# Patient Record
Sex: Female | Born: 1970 | Race: White | Hispanic: No | Marital: Married | Smoking: Never smoker
Health system: Southern US, Community
[De-identification: ages and names within clinical notes are randomized; demographics above are authoritative.]

## PROBLEM LIST (undated history)

## (undated) ENCOUNTER — Emergency Department (HOSPITAL_COMMUNITY): Admission: EM | Payer: Self-pay | Source: Home / Self Care

## (undated) DIAGNOSIS — E079 Disorder of thyroid, unspecified: Secondary | ICD-10-CM

## (undated) DIAGNOSIS — I10 Essential (primary) hypertension: Secondary | ICD-10-CM

## (undated) DIAGNOSIS — F419 Anxiety disorder, unspecified: Secondary | ICD-10-CM

## (undated) DIAGNOSIS — F32A Depression, unspecified: Secondary | ICD-10-CM

## (undated) DIAGNOSIS — F329 Major depressive disorder, single episode, unspecified: Secondary | ICD-10-CM

## (undated) DIAGNOSIS — K589 Irritable bowel syndrome without diarrhea: Secondary | ICD-10-CM

## (undated) DIAGNOSIS — G43109 Migraine with aura, not intractable, without status migrainosus: Secondary | ICD-10-CM

## (undated) DIAGNOSIS — K219 Gastro-esophageal reflux disease without esophagitis: Secondary | ICD-10-CM

## (undated) HISTORY — DX: Migraine with aura, not intractable, without status migrainosus: G43.109

## (undated) HISTORY — DX: Depression, unspecified: F32.A

## (undated) HISTORY — PX: NASAL SEPTUM SURGERY: SHX37

## (undated) HISTORY — DX: Gastro-esophageal reflux disease without esophagitis: K21.9

## (undated) HISTORY — DX: Disorder of thyroid, unspecified: E07.9

## (undated) HISTORY — DX: Major depressive disorder, single episode, unspecified: F32.9

## (undated) HISTORY — DX: Irritable bowel syndrome without diarrhea: K58.9

## (undated) HISTORY — PX: TOTAL HIP ARTHROPLASTY: SHX124

## (undated) HISTORY — DX: Anxiety disorder, unspecified: F41.9

## (undated) HISTORY — DX: Essential (primary) hypertension: I10

---

## 2004-09-05 ENCOUNTER — Ambulatory Visit: Payer: Self-pay | Admitting: Cardiology

## 2011-09-18 ENCOUNTER — Other Ambulatory Visit: Payer: Self-pay | Admitting: Obstetrics & Gynecology

## 2011-09-18 DIAGNOSIS — N6320 Unspecified lump in the left breast, unspecified quadrant: Secondary | ICD-10-CM

## 2011-10-01 ENCOUNTER — Ambulatory Visit
Admission: RE | Admit: 2011-10-01 | Discharge: 2011-10-01 | Disposition: A | Discharge status: Home / Self Care | Payer: Managed Care, Other (non HMO) | Source: Ambulatory Visit | Attending: Obstetrics & Gynecology | Admitting: Obstetrics & Gynecology

## 2011-10-01 DIAGNOSIS — N6320 Unspecified lump in the left breast, unspecified quadrant: Secondary | ICD-10-CM

## 2011-10-01 LAB — HM MAMMOGRAPHY: HM Mammogram: NORMAL

## 2013-05-31 ENCOUNTER — Ambulatory Visit (INDEPENDENT_AMBULATORY_CARE_PROVIDER_SITE_OTHER): Payer: Managed Care, Other (non HMO) | Admitting: Internal Medicine

## 2013-05-31 ENCOUNTER — Ambulatory Visit (INDEPENDENT_AMBULATORY_CARE_PROVIDER_SITE_OTHER)
Admission: RE | Admit: 2013-05-31 | Discharge: 2013-05-31 | Disposition: A | Discharge status: Home / Self Care | Payer: Managed Care, Other (non HMO) | Source: Ambulatory Visit | Attending: Internal Medicine | Admitting: Internal Medicine

## 2013-05-31 ENCOUNTER — Encounter: Payer: Self-pay | Admitting: Internal Medicine

## 2013-05-31 ENCOUNTER — Other Ambulatory Visit (INDEPENDENT_AMBULATORY_CARE_PROVIDER_SITE_OTHER): Payer: Managed Care, Other (non HMO)

## 2013-05-31 ENCOUNTER — Institutional Professional Consult (permissible substitution): Payer: Self-pay | Admitting: Critical Care Medicine

## 2013-05-31 VITALS — BP 90/60 | HR 80 | Temp 97.8°F | Ht 62.5 in | Wt 111.8 lb

## 2013-05-31 VITALS — BP 112/70 | HR 92 | Temp 97.8°F | Resp 16 | Ht 63.0 in | Wt 112.0 lb

## 2013-05-31 DIAGNOSIS — R059 Cough, unspecified: Secondary | ICD-10-CM

## 2013-05-31 DIAGNOSIS — R0600 Dyspnea, unspecified: Secondary | ICD-10-CM

## 2013-05-31 DIAGNOSIS — E039 Hypothyroidism, unspecified: Secondary | ICD-10-CM

## 2013-05-31 DIAGNOSIS — R0989 Other specified symptoms and signs involving the circulatory and respiratory systems: Secondary | ICD-10-CM

## 2013-05-31 DIAGNOSIS — R05 Cough: Secondary | ICD-10-CM

## 2013-05-31 DIAGNOSIS — J45901 Unspecified asthma with (acute) exacerbation: Secondary | ICD-10-CM | POA: Insufficient documentation

## 2013-05-31 DIAGNOSIS — R061 Stridor: Secondary | ICD-10-CM

## 2013-05-31 LAB — LIPID PANEL
Cholesterol: 232 mg/dL — ABNORMAL HIGH (ref 0–200)
HDL: 89.1 mg/dL (ref >39.00)
VLDL: 15.4 mg/dL (ref 0.0–40.0)

## 2013-05-31 LAB — COMPREHENSIVE METABOLIC PANEL
BUN: 9 mg/dL (ref 6–23)
CO2: 28 mEq/L (ref 19–32)
Calcium: 9.4 mg/dL (ref 8.4–10.5)
Chloride: 104 mEq/L (ref 96–112)
Creatinine, Ser: 0.9 mg/dL (ref 0.4–1.2)
GFR: 70.26 mL/min (ref >60.00)
Glucose, Bld: 68 mg/dL — ABNORMAL LOW (ref 70–99)

## 2013-05-31 LAB — CBC WITH DIFFERENTIAL/PLATELET
Basophils Absolute: 0 10*3/uL (ref 0.0–0.1)
Basophils Relative: 0.4 % (ref 0.0–3.0)
Hemoglobin: 14.8 g/dL (ref 12.0–15.0)
Lymphocytes Relative: 20.8 % (ref 12.0–46.0)
Monocytes Relative: 8.8 % (ref 3.0–12.0)
Neutro Abs: 4.4 10*3/uL (ref 1.4–7.7)
RBC: 4.66 Mil/uL (ref 3.87–5.11)
RDW: 12.8 % (ref 11.5–14.6)
WBC: 6.5 10*3/uL (ref 4.5–10.5)

## 2013-05-31 LAB — LDL CHOLESTEROL, DIRECT: Direct LDL: 123.8 mg/dL

## 2013-05-31 MED ORDER — PANTOPRAZOLE SODIUM 40 MG PO TBEC: 40.0000 mg | DELAYED_RELEASE_TABLET | Freq: Every day | ORAL | Status: DC

## 2013-05-31 MED ORDER — FAMOTIDINE 20 MG PO TABS: ORAL_TABLET | ORAL | Status: DC

## 2013-05-31 MED ORDER — TRAMADOL HCL 50 MG PO TABS: ORAL_TABLET | ORAL | Status: DC

## 2013-05-31 MED ORDER — LEVOTHYROXINE SODIUM 25 MCG PO CAPS: 1.0000 | ORAL_CAPSULE | Freq: Every day | ORAL | Status: DC

## 2013-05-31 MED ORDER — PREDNISONE (PAK) 10 MG PO TABS: ORAL_TABLET | ORAL | Status: DC

## 2013-05-31 NOTE — Patient Instructions (Addendum)

## 2013-05-31 NOTE — Assessment & Plan Note (Addendum)
She has already tried steroids and inhalers with no improvement, I am concerned that this is something more complicated than asthma so she will see pulm this afternoon For now, I will check her CBC and ESR to see if there is concern for allergic disease, vasculitis, etc

## 2013-05-31 NOTE — Progress Notes (Signed)
  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  Has ent doctor but has not seen him. Has hypothyroidism but no obvious goiter by hx.  Has been intubated several times for surgery but not recently.  No obvious daytime variabilty or assoc   chest tightness, subjective wheeze overt sinus or hb symptoms. No unusual exp hx or h/o childhood pna/ asthma or knowledge of premature birth.    Also denies any obvious fluctuation of symptoms with weather or environmental changes or other aggravating or alleviating factors except as outlined above   Review of Systems  Constitutional: Negative for fever, chills and unexpected weight change.  HENT: Negative for ear pain, nosebleeds, congestion, sore throat, rhinorrhea, sneezing, trouble swallowing, dental problem, voice change, postnasal drip and sinus pressure.   Eyes: Negative for visual disturbance.  Respiratory: Positive for cough and shortness of breath. Negative for choking.   Cardiovascular: Negative for chest pain and leg swelling.  Gastrointestinal: Negative for vomiting, abdominal pain and diarrhea.  Genitourinary: Negative for difficulty urinating.  Musculoskeletal: Negative for arthralgias.  Skin: Negative for rash.  Neurological: Negative for tremors, syncope and headaches.  Hematological: Does not bruise/bleed easily.       Objective:   Physical Exam   Anxious amb wf good phonation but classic pseudowheeze  Wt Readings from Last 3 Encounters:  05/31/13 111 lb 12.8 oz (50.712 kg)  05/31/13 112 lb (50.803 kg)     HEENT: nl  dentition, turbinates, and orophanx. Nl external ear canals without cough reflex   NECK :  without JVD/Nodes/TM/ nl carotid upstrokes bilaterally   LUNGS: no acc muscle use, clear to A and P bilaterally without cough on insp or exp maneuvers but classic insp "pseudowheeze"    CV:  RRR  no s3 or murmur or increase in P2, no edema   ABD:  soft and nontender with nl excursion in the supine position. No bruits or organomegaly, bowel sounds nl  MS:  warm without deformities, calf tenderness, cyanosis or clubbing  SKIN: warm and dry without lesions    NEURO:  alert, approp, no deficits   cxr 05/31/12 Nl trachea/ negative chest findings     Assessment & Plan:

## 2013-05-31 NOTE — Assessment & Plan Note (Signed)
Her Chest xray is normal but it sounds like she has upper airway obstruction so I have set her up to see Dr. Sherene Sires this afternoon for further evaluation

## 2013-05-31 NOTE — Assessment & Plan Note (Signed)
She will start low dose tirosint Will recheck her labs today

## 2013-05-31 NOTE — Patient Instructions (Addendum)
The key to effective treatment for your cough is eliminating the non-stop cycle of cough you're stuck in long enough to let your airway heal completely and then see if there is anything still making you cough once you stop the cough suppression, but this should take no more than 5 days to figure out  First take delsym two tsp every 12 hours and supplement if needed with  tramadol 50 mg up to 2 every 4 hours to suppress the urge to cough at all or even clear your throat. Swallowing water or using ice chips/non mint and menthol containing candies (such as lifesavers or sugarless jolly ranchers) are also effective.  You should rest your voice and avoid activities that you know make you cough.  Once you have eliminated the cough for 3 straight days try reducing the tramadol first,  then the delsym as tolerated.    Pantoprazole (protonix) 40 mg   Take 30-60 min before first meal of the day and Pepcid 20 mg one bedtime until return to office - this is the best way to tell whether stomach acid is contributing to your problem.    GERD (REFLUX)  is an extremely common cause of respiratory symptoms, many times with no significant heartburn at all.    It can be treated with medication, but also with lifestyle changes including avoidance of late meals, excessive alcohol, smoking cessation, and avoid fatty foods, chocolate, peppermint, colas, red wine, and acidic juices such as orange juice.  NO MINT OR MENTHOL PRODUCTS SO NO COUGH DROPS  USE SUGARLESS CANDY INSTEAD (jolley ranchers or Stover's)  NO OIL BASED VITAMINS - use powdered substitutes.    Prednisone 10 mg take  4 each am x 2 days,   2 each am x 2 days,  1 each am x 2 days and stop    Next step is to see Dr Verdie Drown for a quick look at your upper airway - give him a call and see if you get an appt for "stridor" within a week and if your can't call me for ENT referral in [facility]  Go to ER in meantime if condition worsens on above plan.

## 2013-05-31 NOTE — Progress Notes (Signed)
  Subjective:    Patient ID: Katrina Ross, female    DOB: Apr 21, 1971, 42 y.o.   MRN: [id number]  Cough This is a recurrent problem. The current episode started more than 1 month ago. The problem has been gradually worsening. The problem occurs every few hours. The cough is non-productive. Associated symptoms include shortness of breath and wheezing. Pertinent negatives include no chest pain, chills, ear congestion, ear pain, fever, headaches, heartburn, hemoptysis, myalgias, nasal congestion, postnasal drip, rash, rhinorrhea, sore throat, sweats or weight loss. She has tried oral steroids, a beta-agonist inhaler and steroid inhaler (she was seen one month ago at an [facility] in [place] and was given a steroid shot and inhalers and zpak that did not help at all) for the symptoms. The treatment provided no relief.      Review of Systems  Constitutional: Positive for fatigue. Negative for fever, chills, weight loss, diaphoresis, activity change, appetite change and unexpected weight change.  HENT: Negative for ear pain, sore throat, rhinorrhea and postnasal drip.   Eyes: Negative.   Respiratory: Positive for cough, shortness of breath, wheezing and stridor. Negative for apnea, hemoptysis, choking and chest tightness.   Cardiovascular: Negative.  Negative for chest pain, palpitations and leg swelling.  Gastrointestinal: Positive for constipation. Negative for heartburn, nausea, vomiting, abdominal pain, diarrhea, abdominal distention, anal bleeding and rectal pain.  Endocrine: Negative.   Genitourinary: Negative.   Musculoskeletal: Negative.  Negative for myalgias.  Skin: Negative.  Negative for rash.  Allergic/Immunologic: Negative.   Neurological: Negative.  Negative for headaches.  Hematological: Negative.   Psychiatric/Behavioral: Negative.        Objective:   Physical Exam  Vitals reviewed. Constitutional: She is oriented to person, place, and time. She appears well-developed and  well-nourished. No distress.  HENT:  Head: Normocephalic and atraumatic.  Mouth/Throat: Oropharynx is clear and moist. No oropharyngeal exudate.  Eyes: Conjunctivae are normal. Right eye exhibits no discharge. Left eye exhibits no discharge. No scleral icterus.  Neck: Normal range of motion. Neck supple. No JVD present. No tracheal deviation present. No thyromegaly present.  Cardiovascular: Normal rate, regular rhythm, normal heart sounds and intact distal pulses.  Exam reveals no gallop and no friction rub.   No murmur heard. Pulmonary/Chest: Accessory muscle usage and stridor (she has mild exp stridor and audible wheezing) present. Not tachypneic. She is in respiratory distress. She has no decreased breath sounds. She has wheezes in the right middle field, the right lower field, the left middle field and the left lower field. She has rhonchi in the right upper field and the left upper field. She has no rales. She exhibits no tenderness.  Abdominal: Soft. Bowel sounds are normal. She exhibits no distension and no mass. There is no tenderness. There is no rebound and no guarding.  Musculoskeletal: Normal range of motion. She exhibits no edema and no tenderness.  Lymphadenopathy:    She has no cervical adenopathy.  Neurological: She is oriented to person, place, and time.  Skin: Skin is warm and dry. No rash noted. She is not diaphoretic. No erythema. No pallor.  Psychiatric: She has a normal mood and affect. Her behavior is normal. Judgment and thought content normal.     Lab Results  Component Value Date   TSH 11.61 05/01/2013       Assessment & Plan:

## 2013-05-31 NOTE — Progress Notes (Deleted)
  Subjective:    Patient ID: Katrina Ross, female    DOB: Nov 20, 1970, 42 y.o.   MRN: [id number]  HPI    Review of Systems     Objective:   Physical Exam  Pulmonary/Chest: No accessory muscle usage. Not tachypneic. She is in respiratory distress. She has no decreased breath sounds. She has wheezes in the right lower field and the left lower field. She has rhonchi in the right upper field, the right middle field, the left middle field and the left lower field. She has no rales.      Lab Results  Component Value Date   TSH 11.61 05/01/2013      Assessment & Plan:

## 2013-06-01 DIAGNOSIS — J386 Stenosis of larynx: Secondary | ICD-10-CM | POA: Insufficient documentation

## 2013-06-01 NOTE — Assessment & Plan Note (Addendum)
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

## 2013-09-12 ENCOUNTER — Encounter: Payer: Self-pay | Admitting: Internal Medicine

## 2013-09-12 ENCOUNTER — Ambulatory Visit (INDEPENDENT_AMBULATORY_CARE_PROVIDER_SITE_OTHER): Payer: Managed Care, Other (non HMO) | Admitting: Internal Medicine

## 2013-09-12 ENCOUNTER — Other Ambulatory Visit (INDEPENDENT_AMBULATORY_CARE_PROVIDER_SITE_OTHER): Payer: Managed Care, Other (non HMO)

## 2013-09-12 VITALS — BP 116/78 | HR 83 | Temp 97.6°F | Resp 16 | Ht 62.5 in | Wt 116.0 lb

## 2013-09-12 DIAGNOSIS — E039 Hypothyroidism, unspecified: Secondary | ICD-10-CM

## 2013-09-12 DIAGNOSIS — Z23 Encounter for immunization: Secondary | ICD-10-CM

## 2013-09-12 DIAGNOSIS — J386 Stenosis of larynx: Secondary | ICD-10-CM

## 2013-09-12 LAB — TSH: TSH: 5.3 u[IU]/mL (ref 0.35–5.50)

## 2013-09-12 MED ORDER — LEVOTHYROXINE SODIUM 25 MCG PO CAPS: 1.0000 | ORAL_CAPSULE | Freq: Every day | ORAL | Status: DC

## 2013-09-12 NOTE — Patient Instructions (Signed)

## 2013-09-12 NOTE — Progress Notes (Signed)
Pre visit review using our clinic review tool, if applicable. No additional management support is needed unless otherwise documented below in the visit note. 

## 2013-09-12 NOTE — Progress Notes (Signed)
  Subjective:    Patient ID: Katrina Ross, female    DOB: 10-14-1971, 42 y.o.   MRN: [id number]  Thyroid Problem Presents for follow-up visit. Symptoms include menstrual problem (she has irregular menses). Patient reports no anxiety, cold intolerance, constipation, depressed mood, diaphoresis, diarrhea, dry skin, fatigue, hair loss, heat intolerance, hoarse voice, leg swelling, nail problem, palpitations, tremors, visual change, weight gain or weight loss. The symptoms have been stable. Past treatments include levothyroxine. The treatment provided mild relief.      Review of Systems  Constitutional: Negative.  Negative for fever, chills, weight loss, weight gain, diaphoresis, appetite change and fatigue.  HENT: Negative.  Negative for congestion, hoarse voice, sinus pressure, sore throat and trouble swallowing.   Eyes: Negative.   Respiratory: Negative.  Negative for cough, chest tightness, shortness of breath, wheezing and stridor.   Cardiovascular: Negative.  Negative for chest pain, palpitations and leg swelling.  Gastrointestinal: Negative.  Negative for nausea, vomiting, abdominal pain, diarrhea and constipation.  Endocrine: Negative.  Negative for cold intolerance and heat intolerance.  Genitourinary: Positive for menstrual problem (she has irregular menses).  Musculoskeletal: Negative.  Negative for arthralgias, neck pain and neck stiffness.  Skin: Negative.   Allergic/Immunologic: Negative.   Neurological: Negative.  Negative for tremors.  Hematological: Negative.  Negative for adenopathy. Does not bruise/bleed easily.  Psychiatric/Behavioral: Negative.        Objective:   Physical Exam  Vitals reviewed. Constitutional: She is oriented to person, place, and time. She appears well-developed and well-nourished. No distress.  HENT:  Head: Normocephalic and atraumatic.  Mouth/Throat: Oropharynx is clear and moist. No oropharyngeal exudate.  Eyes: Conjunctivae are normal. Right  eye exhibits no discharge. Left eye exhibits no discharge. No scleral icterus.  Neck: Normal range of motion. Neck supple. No JVD present. No tracheal deviation present. No thyromegaly present.  Cardiovascular: Normal rate, regular rhythm, normal heart sounds and intact distal pulses.  Exam reveals no gallop and no friction rub.   No murmur heard. Pulmonary/Chest: Effort normal and breath sounds normal. No stridor. No respiratory distress. She has no wheezes. She has no rales. She exhibits no tenderness.  Abdominal: Soft. Bowel sounds are normal. She exhibits no distension and no mass. There is no tenderness. There is no rebound and no guarding.  Musculoskeletal: Normal range of motion. She exhibits no edema and no tenderness.  Lymphadenopathy:    She has no cervical adenopathy.  Neurological: She is oriented to person, place, and time.  Skin: Skin is warm and dry. No rash noted. She is not diaphoretic. No erythema. No pallor.  Psychiatric: She has a normal mood and affect. Her behavior is normal. Judgment and thought content normal.     Lab Results  Component Value Date   WBC 6.5 05/31/2013   HGB 14.8 05/31/2013   HCT 43.8 05/31/2013   PLT 331.0 05/31/2013   GLUCOSE 68* 05/31/2013   CHOL 232* 05/31/2013   TRIG 77.0 05/31/2013   HDL 89.10 05/31/2013   LDLDIRECT 123.8 05/31/2013   ALT 14 05/31/2013   AST 18 05/31/2013   NA 140 05/31/2013   K 4.1 05/31/2013   CL 104 05/31/2013   CREATININE 0.9 05/31/2013   BUN 9 05/31/2013   CO2 28 05/31/2013   TSH 6.75* 05/31/2013       Assessment & Plan:

## 2013-09-12 NOTE — Assessment & Plan Note (Signed)
Her TSH is in the normal range, she will stay on the same dose

## 2013-09-12 NOTE — Assessment & Plan Note (Signed)
Improvement noted 

## 2013-09-13 ENCOUNTER — Telehealth: Payer: Self-pay

## 2013-09-13 NOTE — Telephone Encounter (Signed)
Spoke with pharmacist advised of MDs message, called pt advised of same

## 2013-09-13 NOTE — Telephone Encounter (Signed)
yes

## 2013-09-13 NOTE — Telephone Encounter (Signed)
Pharmacy states RX for levothyroxine caps are brand name onle and will cost over $100. Thay would like to know if ok to dispense the generic tabs for $10?

## 2013-10-15 ENCOUNTER — Other Ambulatory Visit: Payer: Self-pay | Admitting: Internal Medicine

## 2013-11-22 ENCOUNTER — Other Ambulatory Visit: Payer: Self-pay | Admitting: Internal Medicine

## 2014-01-18 ENCOUNTER — Other Ambulatory Visit: Payer: Self-pay | Admitting: Internal Medicine

## 2014-04-10 ENCOUNTER — Telehealth: Payer: Self-pay | Admitting: *Deleted

## 2014-04-10 NOTE — Telephone Encounter (Signed)
Pt called requesting whether there is a lab to test for Menopause.  Please advise

## 2014-04-11 NOTE — Telephone Encounter (Signed)
Yes, there is

## 2014-04-11 NOTE — Telephone Encounter (Signed)
Spoke with pt advised of MDs message 

## 2014-04-18 ENCOUNTER — Encounter: Payer: Self-pay | Admitting: Internal Medicine

## 2014-04-18 ENCOUNTER — Ambulatory Visit (INDEPENDENT_AMBULATORY_CARE_PROVIDER_SITE_OTHER): Payer: Managed Care, Other (non HMO) | Admitting: Internal Medicine

## 2014-04-18 ENCOUNTER — Other Ambulatory Visit (INDEPENDENT_AMBULATORY_CARE_PROVIDER_SITE_OTHER): Payer: Managed Care, Other (non HMO)

## 2014-04-18 ENCOUNTER — Ambulatory Visit (INDEPENDENT_AMBULATORY_CARE_PROVIDER_SITE_OTHER)
Admission: RE | Admit: 2014-04-18 | Discharge: 2014-04-18 | Disposition: A | Discharge status: Home / Self Care | Payer: Managed Care, Other (non HMO) | Source: Ambulatory Visit | Attending: Internal Medicine | Admitting: Internal Medicine

## 2014-04-18 VITALS — BP 102/60 | HR 78 | Temp 98.1°F | Resp 16 | Ht 62.5 in | Wt 113.2 lb

## 2014-04-18 DIAGNOSIS — R079 Chest pain, unspecified: Secondary | ICD-10-CM

## 2014-04-18 DIAGNOSIS — E039 Hypothyroidism, unspecified: Secondary | ICD-10-CM

## 2014-04-18 DIAGNOSIS — R0789 Other chest pain: Secondary | ICD-10-CM

## 2014-04-18 DIAGNOSIS — K219 Gastro-esophageal reflux disease without esophagitis: Secondary | ICD-10-CM | POA: Insufficient documentation

## 2014-04-18 DIAGNOSIS — N912 Amenorrhea, unspecified: Secondary | ICD-10-CM

## 2014-04-18 DIAGNOSIS — J386 Stenosis of larynx: Secondary | ICD-10-CM

## 2014-04-18 DIAGNOSIS — K2100 Gastro-esophageal reflux disease with esophagitis, without bleeding: Secondary | ICD-10-CM

## 2014-04-18 LAB — CBC WITH DIFFERENTIAL/PLATELET
Basophils Absolute: 0 10*3/uL (ref 0.0–0.1)
Basophils Relative: 0.5 % (ref 0.0–3.0)
EOS ABS: 0.2 10*3/uL (ref 0.0–0.7)
Eosinophils Relative: 2.5 % (ref 0.0–5.0)
HCT: 42.3 % (ref 36.0–46.0)
Hemoglobin: 14.4 g/dL (ref 12.0–15.0)
LYMPHS PCT: 28.9 % (ref 12.0–46.0)
Lymphs Abs: 2 10*3/uL (ref 0.7–4.0)
MCHC: 34 g/dL (ref 30.0–36.0)
MCV: 91.2 fl (ref 78.0–100.0)
Monocytes Absolute: 0.6 10*3/uL (ref 0.1–1.0)
Monocytes Relative: 9.4 % (ref 3.0–12.0)
NEUTROS PCT: 58.7 % (ref 43.0–77.0)
Neutro Abs: 4 10*3/uL (ref 1.4–7.7)
PLATELETS: 326 10*3/uL (ref 150.0–400.0)
RBC: 4.63 Mil/uL (ref 3.87–5.11)
RDW: 12.1 % (ref 11.5–15.5)
WBC: 6.8 10*3/uL (ref 4.0–10.5)

## 2014-04-18 LAB — COMPREHENSIVE METABOLIC PANEL
ALT: 31 U/L (ref 0–35)
AST: 30 U/L (ref 0–37)
Albumin: 4.5 g/dL (ref 3.5–5.2)
Alkaline Phosphatase: 104 U/L (ref 39–117)
BUN: 17 mg/dL (ref 6–23)
CALCIUM: 9.6 mg/dL (ref 8.4–10.5)
CHLORIDE: 102 meq/L (ref 96–112)
CO2: 30 mEq/L (ref 19–32)
Creatinine, Ser: 0.9 mg/dL (ref 0.4–1.2)
GFR: 73.61 mL/min (ref >60.00)
Glucose, Bld: 109 mg/dL — ABNORMAL HIGH (ref 70–99)
POTASSIUM: 4.5 meq/L (ref 3.5–5.1)
SODIUM: 139 meq/L (ref 135–145)
Total Bilirubin: 0.4 mg/dL (ref 0.2–1.2)
Total Protein: 7.6 g/dL (ref 6.0–8.3)

## 2014-04-18 LAB — LUTEINIZING HORMONE: LH: 35.05 m[IU]/mL

## 2014-04-18 LAB — HCG, QUANTITATIVE, PREGNANCY: hCG, Beta Chain, Quant, S: 0.5 m[IU]/mL

## 2014-04-18 LAB — TSH: TSH: 6.9 u[IU]/mL — ABNORMAL HIGH (ref 0.35–4.50)

## 2014-04-18 LAB — FOLLICLE STIMULATING HORMONE: FSH: 106.6 m[IU]/mL

## 2014-04-18 MED ORDER — PANTOPRAZOLE SODIUM 40 MG PO TBEC: DELAYED_RELEASE_TABLET | ORAL | Status: DC

## 2014-04-18 NOTE — Patient Instructions (Signed)
Chest Pain (Nonspecific) °It is often hard to give a specific diagnosis for the cause of chest pain. There is always a chance that your pain could be related to something serious, such as a heart attack or a blood clot in the lungs. You need to follow up with your health care Katrina Ross for further evaluation. °CAUSES  °· Heartburn. °· Pneumonia or bronchitis. °· Anxiety or stress. °· Inflammation around your heart (pericarditis) or lung (pleuritis or pleurisy). °· A blood clot in the lung. °· A collapsed lung (pneumothorax). It can develop suddenly on its own (spontaneous pneumothorax) or from trauma to the chest. °· Shingles infection (herpes zoster virus). °The chest wall is composed of bones, muscles, and cartilage. Any of these can be the source of the pain. °· The bones can be bruised by injury. °· The muscles or cartilage can be strained by coughing or overwork. °· The cartilage can be affected by inflammation and become sore (costochondritis). °DIAGNOSIS  °Lab tests or other studies may be needed to find the cause of your pain. Your health care Katrina Ross may have you take a test called an ambulatory electrocardiogram (ECG). An ECG records your heartbeat patterns over a 24-hour period. You may also have other tests, such as: °· Transthoracic echocardiogram (TTE). During echocardiography, sound waves are used to evaluate how blood flows through your heart. °· Transesophageal echocardiogram (TEE). °· Cardiac monitoring. This allows your health care Katrina Ross to monitor your heart rate and rhythm in real time. °· Holter monitor. This is a portable device that records your heartbeat and can help diagnose heart arrhythmias. It allows your health care Katrina Ross to track your heart activity for several days, if needed. °· Stress tests by exercise or by giving medicine that makes the heart beat faster. °TREATMENT  °· Treatment depends on what may be causing your chest pain. Treatment may include: °¨ Acid blockers for  heartburn. °¨ Anti-inflammatory medicine. °¨ Pain medicine for inflammatory conditions. °¨ Antibiotics if an infection is present. °· You may be advised to change lifestyle habits. This includes stopping smoking and avoiding alcohol, caffeine, and chocolate. °· You may be advised to keep your head raised (elevated) when sleeping. This reduces the chance of acid going backward from your stomach into your esophagus. °Most of the time, nonspecific chest pain will improve within 2-3 days with rest and mild pain medicine.  °HOME CARE INSTRUCTIONS  °· If antibiotics were prescribed, take them as directed. Finish them even if you start to feel better. °· For the next few days, avoid physical activities that bring on chest pain. Continue physical activities as directed. °· Do not use any tobacco products, including cigarettes, chewing tobacco, or electronic cigarettes. °· Avoid drinking alcohol. °· Only take medicine as directed by your health care Katrina Ross. °· Follow your health care Katrina Ross's suggestions for further testing if your chest pain does not go away. °· Keep any follow-up appointments you made. If you do not go to an appointment, you could develop lasting (chronic) problems with pain. If there is any problem keeping an appointment, call to reschedule. °SEEK MEDICAL CARE IF:  °· Your chest pain does not go away, even after treatment. °· You have a rash with blisters on your chest. °· You have a fever. °SEEK IMMEDIATE MEDICAL CARE IF:  °· You have increased chest pain or pain that spreads to your arm, neck, jaw, back, or abdomen. °· You have shortness of breath. °· You have an increasing cough, or you cough   up blood. °· You have severe back or abdominal pain. °· You feel nauseous or vomit. °· You have severe weakness. °· You faint. °· You have chills. °This is an emergency. Do not wait to see if the pain will go away. Get medical help at once. Call your local emergency services (911 in [place] Do not drive  yourself to the hospital. °MAKE SURE YOU:  °· Understand these instructions. °· Will watch your condition. °· Will get help right away if you are not doing well or get worse. °Document Released: 07/29/2005 Document Revised: 10/24/2013 Document Reviewed: 05/24/2008 °[employer]® Patient Information ©2015 [employer]. This information is not intended to replace advice given to you by your health care Katrina Ross. Make sure you discuss any questions you have with your health care Katrina Ross. ° °

## 2014-04-18 NOTE — Progress Notes (Signed)
Pre visit review using our clinic review tool, if applicable. No additional management support is needed unless otherwise documented below in the visit note. 

## 2014-04-18 NOTE — Progress Notes (Signed)
Subjective:    Patient ID: Katrina GoodellRachel Ross, female    DOB: 02/08/1971, 43 y.o.   MRN: [id number]  Chest Pain  This is a recurrent problem. The current episode started 1 to 4 weeks ago. The onset quality is gradual. The problem occurs intermittently. The problem has been unchanged. The pain is present in the lateral region (around left shoulder blade). The pain is at a severity of 2/10. The pain is mild. The quality of the pain is described as sharp. The pain does not radiate. Associated symptoms include back pain. Pertinent negatives include no abdominal pain, claudication, cough, diaphoresis, dizziness, exertional chest pressure, fever, headaches, hemoptysis, irregular heartbeat, leg pain, lower extremity edema, malaise/fatigue, nausea, near-syncope, numbness, orthopnea, palpitations, PND, shortness of breath, sputum production, syncope, vomiting or weakness. The pain is aggravated by movement, deep breathing and emotional upset. She has tried nothing for the symptoms. The treatment provided no relief. Risk factors include oral contraceptive use and stress.      Review of Systems  Constitutional: Positive for fatigue. Negative for fever, chills, malaise/fatigue, diaphoresis, appetite change and unexpected weight change.  HENT: Negative.   Eyes: Negative.   Respiratory: Negative.  Negative for apnea, cough, hemoptysis, sputum production, choking, chest tightness, shortness of breath, wheezing and stridor.   Cardiovascular: Positive for chest pain. Negative for palpitations, orthopnea, claudication, leg swelling, syncope, PND and near-syncope.  Gastrointestinal: Negative.  Negative for nausea, vomiting, abdominal pain, diarrhea, constipation, blood in stool and abdominal distention.  Endocrine: Negative.   Genitourinary: Negative.   Musculoskeletal: Positive for back pain. Negative for arthralgias, gait problem, joint swelling, myalgias, neck pain and neck stiffness.  Skin: Negative.     Allergic/Immunologic: Negative.   Neurological: Negative.  Negative for dizziness, weakness, numbness and headaches.  Hematological: Negative.  Negative for adenopathy. Does not bruise/bleed easily.  Psychiatric/Behavioral: Negative.        Objective:   Physical Exam  Vitals reviewed. Constitutional: She is oriented to person, place, and time. She appears well-developed and well-nourished.  Non-toxic appearance. She does not have a sickly appearance. She does not appear ill. No distress.  HENT:  Head: Normocephalic and atraumatic.  Mouth/Throat: Oropharynx is clear and moist. No oropharyngeal exudate.  Eyes: Conjunctivae are normal. Right eye exhibits no discharge. Left eye exhibits no discharge. No scleral icterus.  Neck: Normal range of motion. Neck supple. No JVD present. No tracheal deviation present. No thyromegaly present.  Cardiovascular: Normal rate, regular rhythm, normal heart sounds and intact distal pulses.  Exam reveals no gallop and no friction rub.   No murmur heard. Pulmonary/Chest: Effort normal and breath sounds normal. No accessory muscle usage or stridor. Not tachypneic. No respiratory distress. She has no wheezes. She has no rales. She exhibits no mass, no tenderness, no bony tenderness, no crepitus, no edema, no deformity and no swelling.  Abdominal: Soft. Bowel sounds are normal. She exhibits no distension and no mass. There is no tenderness. There is no rebound and no guarding.  Musculoskeletal: Normal range of motion. She exhibits no edema and no tenderness.  Lymphadenopathy:    She has no cervical adenopathy.  Neurological: She is oriented to person, place, and time.  Skin: Skin is warm and dry. No rash noted. She is not diaphoretic. No erythema. No pallor.  Psychiatric: She has a normal mood and affect. Her behavior is normal. Judgment and thought content normal.     Lab Results  Component Value Date   WBC 6.5 05/31/2013   HGB 14.8  05/31/2013   HCT 43.8  05/31/2013   PLT 331.0 05/31/2013   GLUCOSE 68* 05/31/2013   CHOL 232* 05/31/2013   TRIG 77.0 05/31/2013   HDL 89.10 05/31/2013   LDLDIRECT 123.8 05/31/2013   ALT 14 05/31/2013   AST 18 05/31/2013   NA 140 05/31/2013   K 4.1 05/31/2013   CL 104 05/31/2013   CREATININE 0.9 05/31/2013   BUN 9 05/31/2013   CO2 28 05/31/2013   TSH 5.30 09/12/2013       Assessment & Plan:

## 2014-04-19 ENCOUNTER — Encounter: Payer: Self-pay | Admitting: Internal Medicine

## 2014-04-19 ENCOUNTER — Telehealth: Payer: Self-pay | Admitting: Internal Medicine

## 2014-04-19 LAB — D-DIMER, QUANTITATIVE: D-Dimer, Quant: 0.27 ug/mL-FEU (ref 0.00–0.48)

## 2014-04-19 MED ORDER — LEVOTHYROXINE SODIUM 50 MCG PO CAPS: 1.0000 | ORAL_CAPSULE | Freq: Every day | ORAL | Status: DC

## 2014-04-19 NOTE — Telephone Encounter (Signed)
Rick with [facility] is calling to request that the patient's prescription be changed from capsules to tablets. He says that capsules only come in the name brand which will cost the patient $137. If her rx is changed to tablets it will be much lower. Please send new rx or call pharmacy to confirm.

## 2014-04-19 NOTE — Assessment & Plan Note (Signed)
Her TSH is a little high so I have increase her dose of synthroid

## 2014-04-19 NOTE — Assessment & Plan Note (Signed)
Restart the PPI

## 2014-04-19 NOTE — Assessment & Plan Note (Signed)
She has a f/up soon with her ENT in [place] about this soon.

## 2014-04-19 NOTE — Telephone Encounter (Signed)
I spoke to [place] and advised okay to change from capsules to tablets

## 2014-04-19 NOTE — Assessment & Plan Note (Addendum)
CXR is normal. D-dimer is negative, EKG is normal I think this is MS, she does not want a medication to treat this

## 2014-04-19 NOTE — Assessment & Plan Note (Signed)
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

## 2014-04-19 NOTE — Telephone Encounter (Signed)
Ok with me 

## 2014-10-14 IMAGING — CR DG CHEST 2V
2 series · 2 of 2 positions shown · non-contrast
Comparison: None.

CLINICAL DATA: Nonsmoker with wheezing for several months

CHEST - 2 VIEW

[view not recorded (1 of 2)]
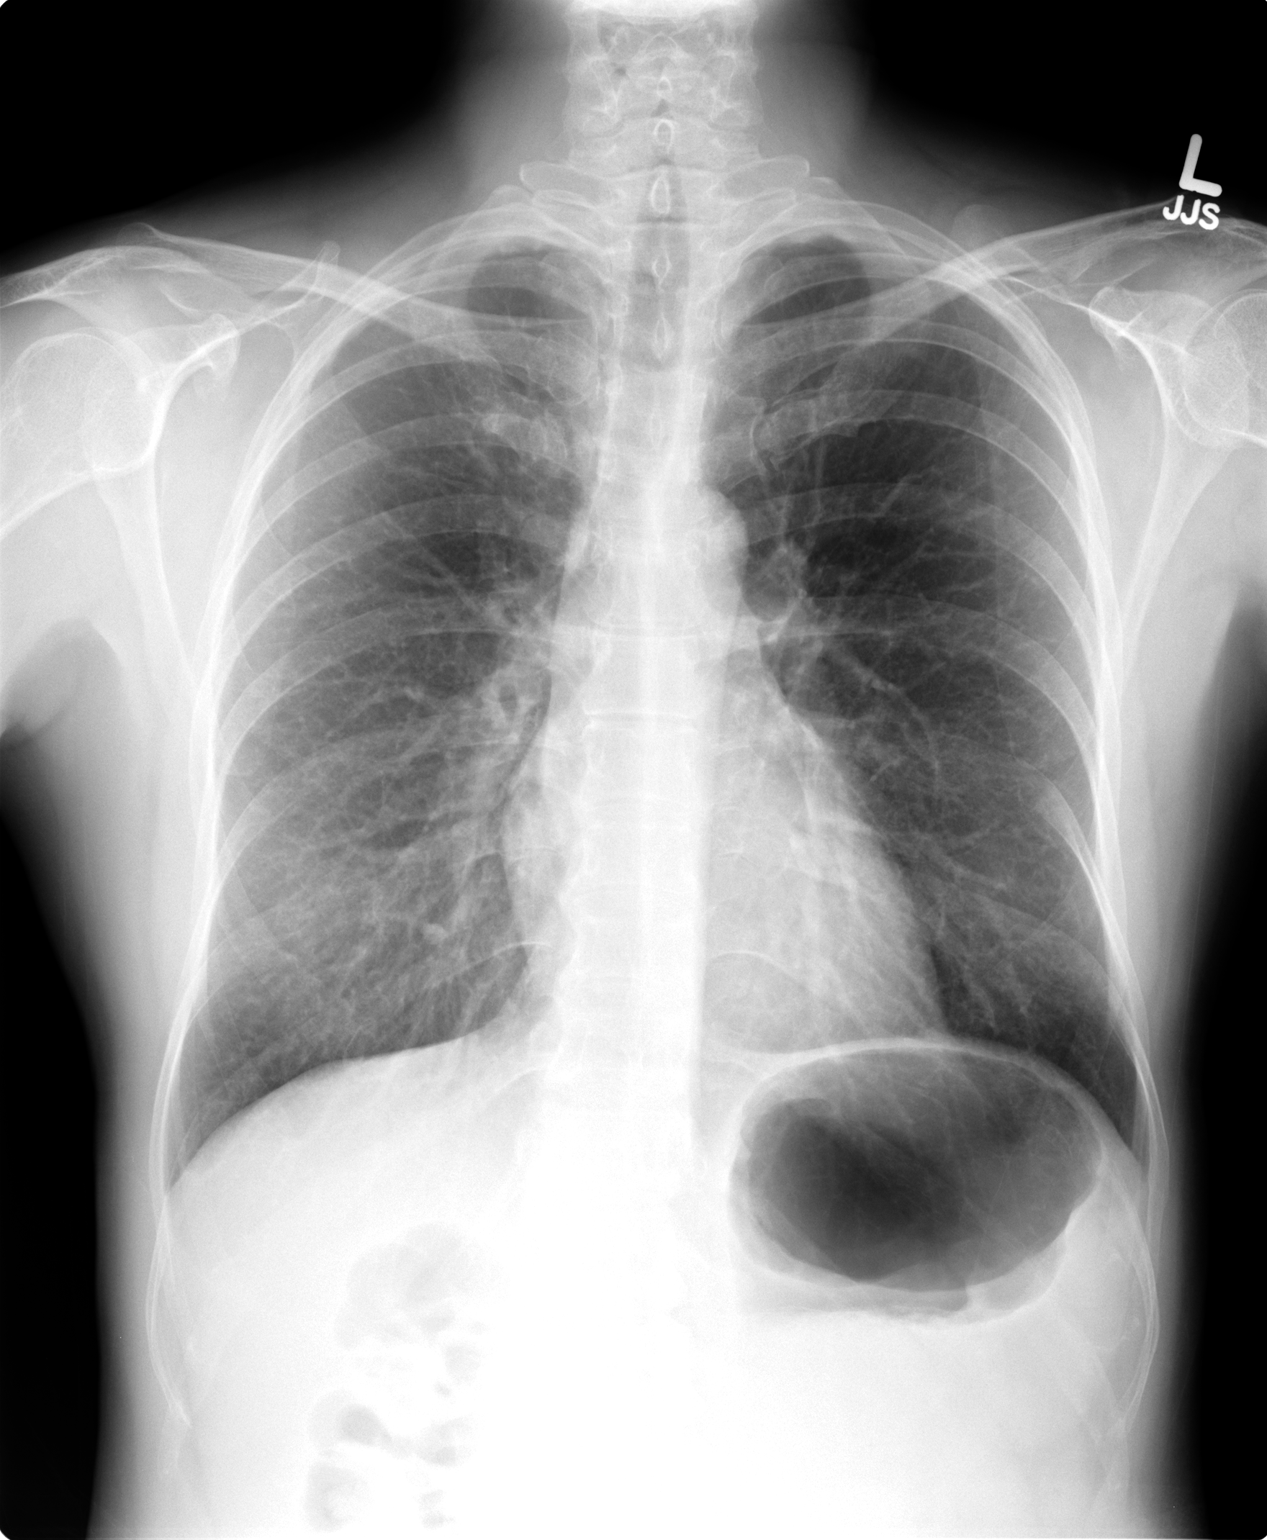

[view not recorded (2 of 2)]
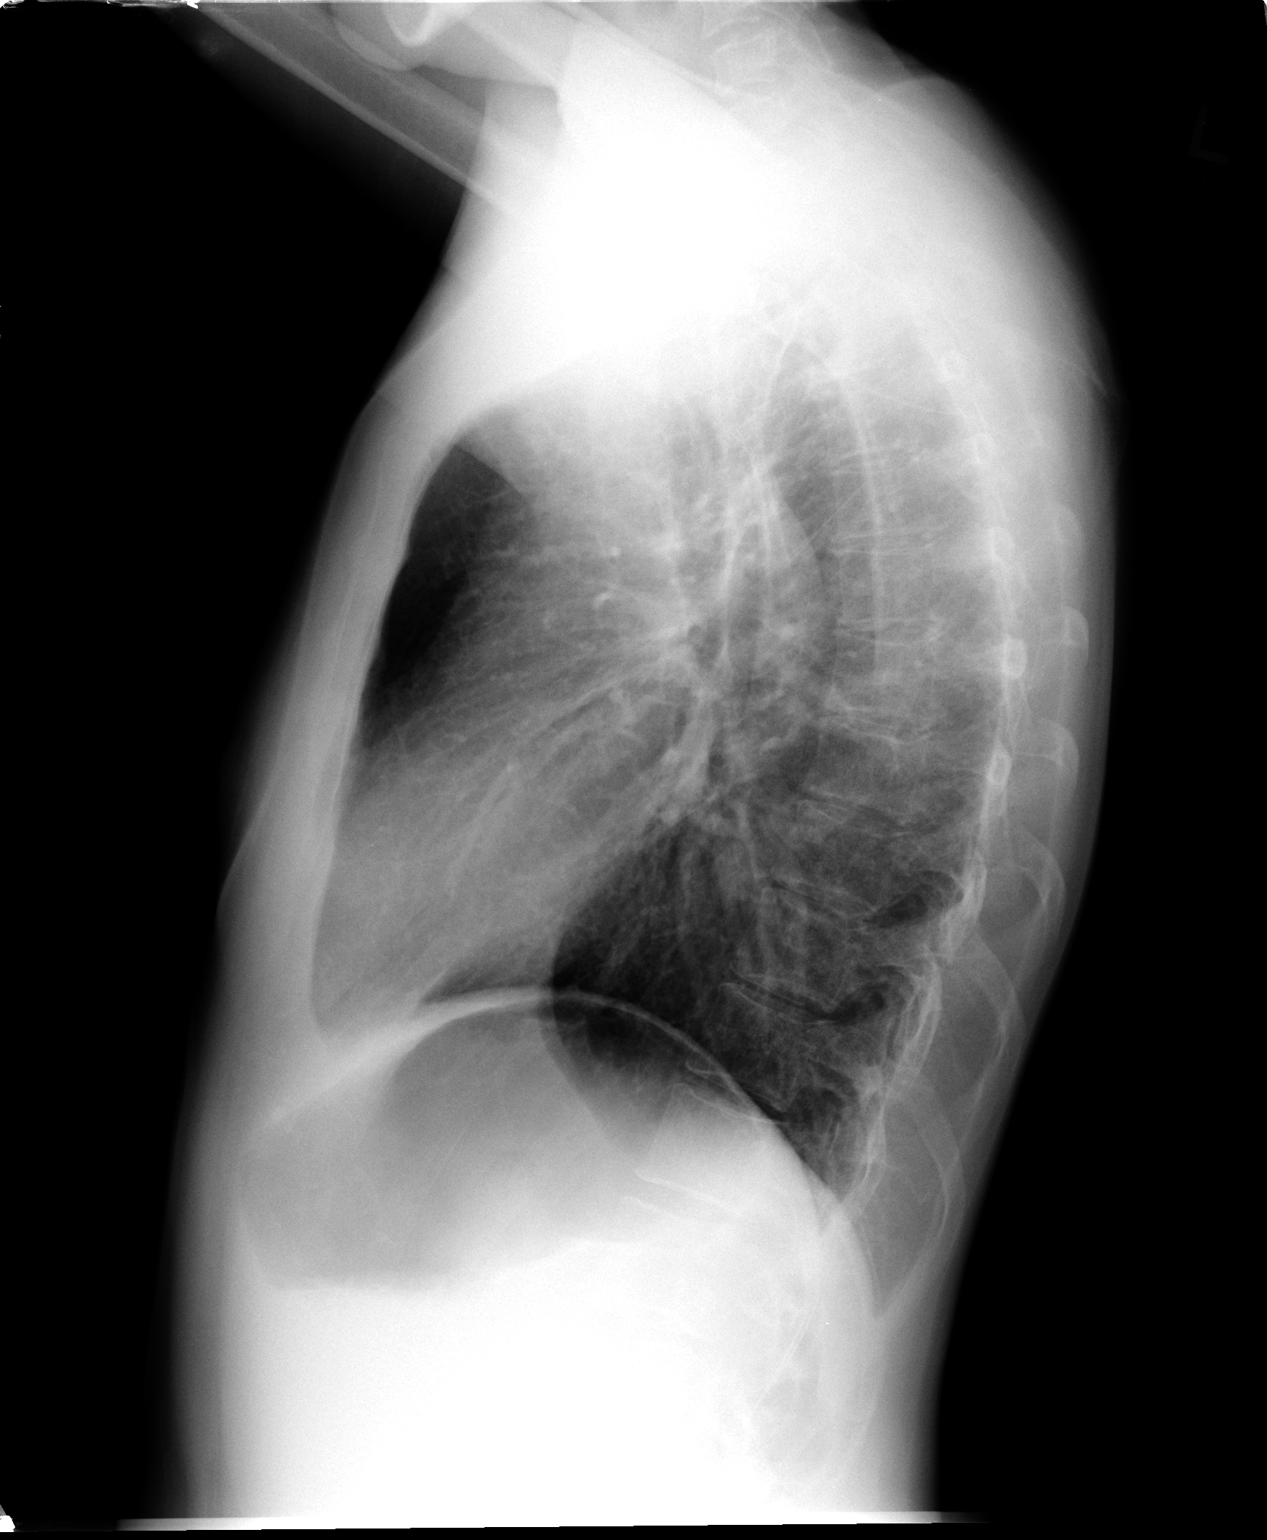

[2 of 2 positions shown; findings below may reference images not displayed]

FINDINGS: The heart size and vascular pattern are normal.  The
lungs are clear.  There are no pleural effusions.  The osseous
thorax is within normal limits.
IMPRESSION: Negative

## 2014-12-20 ENCOUNTER — Other Ambulatory Visit: Payer: Self-pay | Admitting: Internal Medicine

## 2014-12-22 ENCOUNTER — Other Ambulatory Visit: Payer: Self-pay | Admitting: Internal Medicine

## 2014-12-24 ENCOUNTER — Other Ambulatory Visit: Payer: Self-pay

## 2014-12-24 DIAGNOSIS — E038 Other specified hypothyroidism: Secondary | ICD-10-CM

## 2014-12-24 MED ORDER — LEVOTHYROXINE SODIUM 50 MCG PO CAPS: 1.0000 | ORAL_CAPSULE | Freq: Every day | ORAL | Status: DC

## 2015-01-02 ENCOUNTER — Other Ambulatory Visit (INDEPENDENT_AMBULATORY_CARE_PROVIDER_SITE_OTHER): Payer: Managed Care, Other (non HMO)

## 2015-01-02 ENCOUNTER — Ambulatory Visit (INDEPENDENT_AMBULATORY_CARE_PROVIDER_SITE_OTHER): Payer: Managed Care, Other (non HMO) | Admitting: Internal Medicine

## 2015-01-02 VITALS — BP 116/78 | HR 75 | Temp 97.7°F | Resp 18 | Wt 113.0 lb

## 2015-01-02 DIAGNOSIS — E038 Other specified hypothyroidism: Secondary | ICD-10-CM

## 2015-01-02 DIAGNOSIS — R748 Abnormal levels of other serum enzymes: Secondary | ICD-10-CM

## 2015-01-02 LAB — COMPREHENSIVE METABOLIC PANEL
ALT: 56 U/L — ABNORMAL HIGH (ref 0–35)
AST: 39 U/L — ABNORMAL HIGH (ref 0–37)
Albumin: 4.2 g/dL (ref 3.5–5.2)
Alkaline Phosphatase: 125 U/L — ABNORMAL HIGH (ref 39–117)
BUN: 14 mg/dL (ref 6–23)
CALCIUM: 9.3 mg/dL (ref 8.4–10.5)
CHLORIDE: 105 meq/L (ref 96–112)
CO2: 29 meq/L (ref 19–32)
Creatinine, Ser: 0.96 mg/dL (ref 0.40–1.20)
GFR: 67.23 mL/min (ref >60.00)
GLUCOSE: 100 mg/dL — AB (ref 70–99)
Potassium: 3.8 mEq/L (ref 3.5–5.1)
SODIUM: 139 meq/L (ref 135–145)
TOTAL PROTEIN: 7 g/dL (ref 6.0–8.3)
Total Bilirubin: 0.4 mg/dL (ref 0.2–1.2)

## 2015-01-02 LAB — TSH: TSH: 8.49 u[IU]/mL — ABNORMAL HIGH (ref 0.35–4.50)

## 2015-01-02 LAB — LIPID PANEL
CHOLESTEROL: 223 mg/dL — AB (ref 0–200)
HDL: 81.5 mg/dL (ref >39.00)
LDL CALC: 117 mg/dL — AB (ref 0–99)
NonHDL: 141.5
TRIGLYCERIDES: 123 mg/dL (ref 0.0–149.0)
Total CHOL/HDL Ratio: 3
VLDL: 24.6 mg/dL (ref 0.0–40.0)

## 2015-01-02 NOTE — Progress Notes (Signed)
Pre visit review using our clinic review tool, if applicable. No additional management support is needed unless otherwise documented below in the visit note. 

## 2015-01-02 NOTE — Progress Notes (Signed)
   Subjective:    Patient ID: Katrina GoodellRachel Minier, female    DOB: 12-09-1970, 44 y.o.   MRN: [id number]  Thyroid Problem Presents for follow-up visit. Symptoms include fatigue. Patient reports no anxiety, cold intolerance, constipation, depressed mood, diaphoresis, diarrhea, dry skin, hair loss, heat intolerance, hoarse voice, leg swelling, nail problem, palpitations, tremors, visual change, weight gain or weight loss. The symptoms have been stable. Past treatments include levothyroxine. The treatment provided moderate relief.      Review of Systems  Constitutional: Positive for fatigue. Negative for fever, chills, weight loss, weight gain and diaphoresis.  HENT: Negative.  Negative for hoarse voice.   Eyes: Negative.   Respiratory: Negative.  Negative for cough, choking, chest tightness, shortness of breath and stridor.   Cardiovascular: Negative.  Negative for chest pain, palpitations and leg swelling.  Gastrointestinal: Negative.  Negative for nausea, vomiting, abdominal pain, diarrhea and constipation.  Endocrine: Negative.  Negative for cold intolerance and heat intolerance.  Genitourinary: Negative.   Musculoskeletal: Negative.   Skin: Negative.   Allergic/Immunologic: Negative.   Neurological: Negative.  Negative for tremors.  Hematological: Negative.  Negative for adenopathy. Does not bruise/bleed easily.  Psychiatric/Behavioral: Negative.  The patient is not nervous/anxious.        Objective:   Physical Exam  Constitutional: She is oriented to person, place, and time. She appears well-developed and well-nourished. No distress.  HENT:  Head: Normocephalic and atraumatic.  Mouth/Throat: Oropharynx is clear and moist. No oropharyngeal exudate.  Eyes: Conjunctivae are normal. Right eye exhibits no discharge. Left eye exhibits no discharge. No scleral icterus.  Neck: Normal range of motion. Neck supple. No JVD present. No tracheal deviation present. No thyromegaly present.    Cardiovascular: Normal rate, regular rhythm, normal heart sounds and intact distal pulses.  Exam reveals no gallop and no friction rub.   No murmur heard. Pulmonary/Chest: Effort normal and breath sounds normal. No stridor. No respiratory distress. She has no wheezes. She has no rales. She exhibits no tenderness.  Abdominal: Soft. Bowel sounds are normal. She exhibits no distension and no mass. There is no tenderness. There is no rebound and no guarding.  Musculoskeletal: Normal range of motion. She exhibits no edema or tenderness.  Lymphadenopathy:    She has no cervical adenopathy.  Neurological: She is oriented to person, place, and time.  Skin: Skin is warm and dry. No rash noted. She is not diaphoretic. No erythema. No pallor.  Vitals reviewed.    Lab Results  Component Value Date   WBC 6.8 04/18/2014   HGB 14.4 04/18/2014   HCT 42.3 04/18/2014   PLT 326.0 04/18/2014   GLUCOSE 109* 04/18/2014   CHOL 232* 05/31/2013   TRIG 77.0 05/31/2013   HDL 89.10 05/31/2013   LDLDIRECT 123.8 05/31/2013   ALT 31 04/18/2014   AST 30 04/18/2014   NA 139 04/18/2014   K 4.5 04/18/2014   CL 102 04/18/2014   CREATININE 0.9 04/18/2014   BUN 17 04/18/2014   CO2 30 04/18/2014   TSH 6.90* 04/18/2014       Assessment & Plan:

## 2015-01-02 NOTE — Patient Instructions (Signed)
Hypothyroidism The thyroid is a large gland located in the lower front of your neck. The thyroid gland helps control metabolism. Metabolism is how your body handles food. It controls metabolism with the hormone thyroxine. When this gland is underactive (hypothyroid), it produces too little hormone.  CAUSES These include:   Absence or destruction of thyroid tissue.  Goiter due to iodine deficiency.  Goiter due to medications.  Congenital defects (since birth).  Problems with the pituitary. This causes a lack of TSH (thyroid stimulating hormone). This hormone tells the thyroid to turn out more hormone. SYMPTOMS  Lethargy (feeling as though you have no energy)  Cold intolerance  Weight gain (in spite of normal food intake)  Dry skin  Coarse hair  Menstrual irregularity (if severe, may lead to infertility)  Slowing of thought processes Cardiac problems are also caused by insufficient amounts of thyroid hormone. Hypothyroidism in the newborn is cretinism, and is an extreme form. It is important that this form be treated adequately and immediately or it will lead rapidly to retarded physical and mental development. DIAGNOSIS  To prove hypothyroidism, your caregiver may do blood tests and ultrasound tests. Sometimes the signs are hidden. It may be necessary for your caregiver to watch this illness with blood tests either before or after diagnosis and treatment. TREATMENT  Low levels of thyroid hormone are increased by using synthetic thyroid hormone. This is a safe, effective treatment. It usually takes about four weeks to gain the full effects of the medication. After you have the full effect of the medication, it will generally take another four weeks for problems to leave. Your caregiver may start you on low doses. If you have had heart problems the dose may be gradually increased. It is generally not an emergency to get rapidly to normal. HOME CARE INSTRUCTIONS   Take your  medications as your caregiver suggests. Let your caregiver know of any medications you are taking or start taking. Your caregiver will help you with dosage schedules.  As your condition improves, your dosage needs may increase. It will be necessary to have continuing blood tests as suggested by your caregiver.  Report all suspected medication side effects to your caregiver. SEEK MEDICAL CARE IF: Seek medical care if you develop:  Sweating.  Tremulousness (tremors).  Anxiety.  Rapid weight loss.  Heat intolerance.  Emotional swings.  Diarrhea.  Weakness. SEEK IMMEDIATE MEDICAL CARE IF:  You develop chest pain, an irregular heart beat (palpitations), or a rapid heart beat. MAKE SURE YOU:   Understand these instructions.  Will watch your condition.  Will get help right away if you are not doing well or get worse. Document Released: 10/19/2005 Document Revised: 01/11/2012 Document Reviewed: 06/08/2008 Exit[employer] Patient Information 2015 [employer]. This information is not intended to replace advice given to you by your health care provider. Make sure you discuss any questions you have with your health care provider.  

## 2015-01-03 ENCOUNTER — Encounter: Payer: Self-pay | Admitting: Internal Medicine

## 2015-01-03 DIAGNOSIS — R748 Abnormal levels of other serum enzymes: Secondary | ICD-10-CM | POA: Insufficient documentation

## 2015-01-03 MED ORDER — LEVOTHYROXINE SODIUM 75 MCG PO CAPS: 1.0000 | ORAL_CAPSULE | Freq: Every day | ORAL | Status: DC

## 2015-01-03 NOTE — Assessment & Plan Note (Signed)
Her TSH is slightly elevated Will increase her levo-thyroxine dose

## 2015-01-03 NOTE — Assessment & Plan Note (Signed)
I don't see a cause for this  Will bring her back in for a recheck of the liver enzymes

## 2015-01-04 ENCOUNTER — Other Ambulatory Visit: Payer: Self-pay | Admitting: Internal Medicine

## 2015-01-04 DIAGNOSIS — E039 Hypothyroidism, unspecified: Secondary | ICD-10-CM

## 2015-01-04 DIAGNOSIS — R748 Abnormal levels of other serum enzymes: Secondary | ICD-10-CM

## 2015-01-29 ENCOUNTER — Other Ambulatory Visit (INDEPENDENT_AMBULATORY_CARE_PROVIDER_SITE_OTHER): Payer: Managed Care, Other (non HMO)

## 2015-01-29 ENCOUNTER — Encounter: Payer: Self-pay | Admitting: Internal Medicine

## 2015-01-29 DIAGNOSIS — R748 Abnormal levels of other serum enzymes: Secondary | ICD-10-CM | POA: Diagnosis not present

## 2015-01-29 DIAGNOSIS — E039 Hypothyroidism, unspecified: Secondary | ICD-10-CM | POA: Diagnosis not present

## 2015-01-29 LAB — BASIC METABOLIC PANEL
BUN: 13 mg/dL (ref 6–23)
CO2: 30 mEq/L (ref 19–32)
CREATININE: 1.03 mg/dL (ref 0.40–1.20)
Calcium: 9.2 mg/dL (ref 8.4–10.5)
Chloride: 104 mEq/L (ref 96–112)
GFR: 61.96 mL/min (ref >60.00)
Glucose, Bld: 122 mg/dL — ABNORMAL HIGH (ref 70–99)
Potassium: 4.1 mEq/L (ref 3.5–5.1)
SODIUM: 138 meq/L (ref 135–145)

## 2015-01-29 LAB — TSH: TSH: 0.48 u[IU]/mL (ref 0.35–4.50)

## 2015-02-05 ENCOUNTER — Other Ambulatory Visit: Payer: Self-pay | Admitting: Internal Medicine

## 2015-02-05 ENCOUNTER — Telehealth: Payer: Self-pay | Admitting: Internal Medicine

## 2015-02-05 DIAGNOSIS — E038 Other specified hypothyroidism: Secondary | ICD-10-CM

## 2015-02-05 DIAGNOSIS — R748 Abnormal levels of other serum enzymes: Secondary | ICD-10-CM

## 2015-02-05 MED ORDER — LEVOTHYROXINE SODIUM 75 MCG PO TABS: 75.0000 ug | ORAL_TABLET | Freq: Every day | ORAL | Status: DC

## 2015-02-05 NOTE — Telephone Encounter (Signed)
Liver enzymes were not done Please return for recheck Generic rx was sent in

## 2015-02-05 NOTE — Telephone Encounter (Signed)
Was calling about her lab results and needs Levothyroxine Sodium (TIROSINT) 75 MCG CAPS [[id number]] [facility] prescribe [phone]. She wanted to do the mail order but will need a small refill at local pharmacy. Please call patient first, she wants to talk with you. Thank you

## 2015-02-05 NOTE — Telephone Encounter (Signed)
Pt notified of additional testing and will stop by lab for collection.

## 2015-02-05 NOTE — Telephone Encounter (Signed)
Spoke with patient who advised that she can not afford Tirosint and asked pharmacy to dispense generic but they would not. I stated that Rx was not sent in as "band name necessary " but pt would still like to know if there is a different way that this can be sent in. Please advise as pt need a day supply to her local and  90 days to the mail order.  Patient was advised of recent lab results also that a letter was mailed. She inquired if liver enzymes were better, however I was unable to find. Unsure if I overlooked something. Thanks

## 2015-02-06 ENCOUNTER — Other Ambulatory Visit: Payer: Self-pay | Admitting: Internal Medicine

## 2015-02-06 ENCOUNTER — Other Ambulatory Visit (INDEPENDENT_AMBULATORY_CARE_PROVIDER_SITE_OTHER): Payer: Managed Care, Other (non HMO)

## 2015-02-06 DIAGNOSIS — R748 Abnormal levels of other serum enzymes: Secondary | ICD-10-CM | POA: Diagnosis not present

## 2015-02-06 LAB — COMPREHENSIVE METABOLIC PANEL
ALBUMIN: 4.3 g/dL (ref 3.5–5.2)
ALT: 38 U/L — ABNORMAL HIGH (ref 0–35)
AST: 27 U/L (ref 0–37)
Alkaline Phosphatase: 121 U/L — ABNORMAL HIGH (ref 39–117)
BUN: 15 mg/dL (ref 6–23)
CO2: 30 meq/L (ref 19–32)
Calcium: 9.5 mg/dL (ref 8.4–10.5)
Chloride: 103 mEq/L (ref 96–112)
Creatinine, Ser: 0.93 mg/dL (ref 0.40–1.20)
GFR: 69.7 mL/min (ref >60.00)
GLUCOSE: 76 mg/dL (ref 70–99)
POTASSIUM: 4.2 meq/L (ref 3.5–5.1)
SODIUM: 139 meq/L (ref 135–145)
TOTAL PROTEIN: 7.3 g/dL (ref 6.0–8.3)
Total Bilirubin: 0.4 mg/dL (ref 0.2–1.2)

## 2015-02-07 ENCOUNTER — Encounter: Payer: Self-pay | Admitting: Internal Medicine

## 2015-02-07 LAB — HEPATITIS C ANTIBODY: HCV Ab: NEGATIVE

## 2015-02-07 LAB — HEPATITIS B SURFACE ANTIBODY,QUALITATIVE: HEP B S AB: NEGATIVE

## 2015-02-07 LAB — HEPATITIS B CORE ANTIBODY, TOTAL: Hep B Core Total Ab: NONREACTIVE

## 2015-02-07 LAB — HEPATITIS A ANTIBODY, TOTAL: HEP A TOTAL AB: BORDERLINE — AB

## 2015-09-01 IMAGING — CR DG CHEST 2V
2 series · 2 of 2 positions shown · non-contrast
Comparison: 05/31/2013

CLINICAL DATA: Chest pain

EXAM:
CHEST  2 VIEW

[view not recorded (1 of 2)]
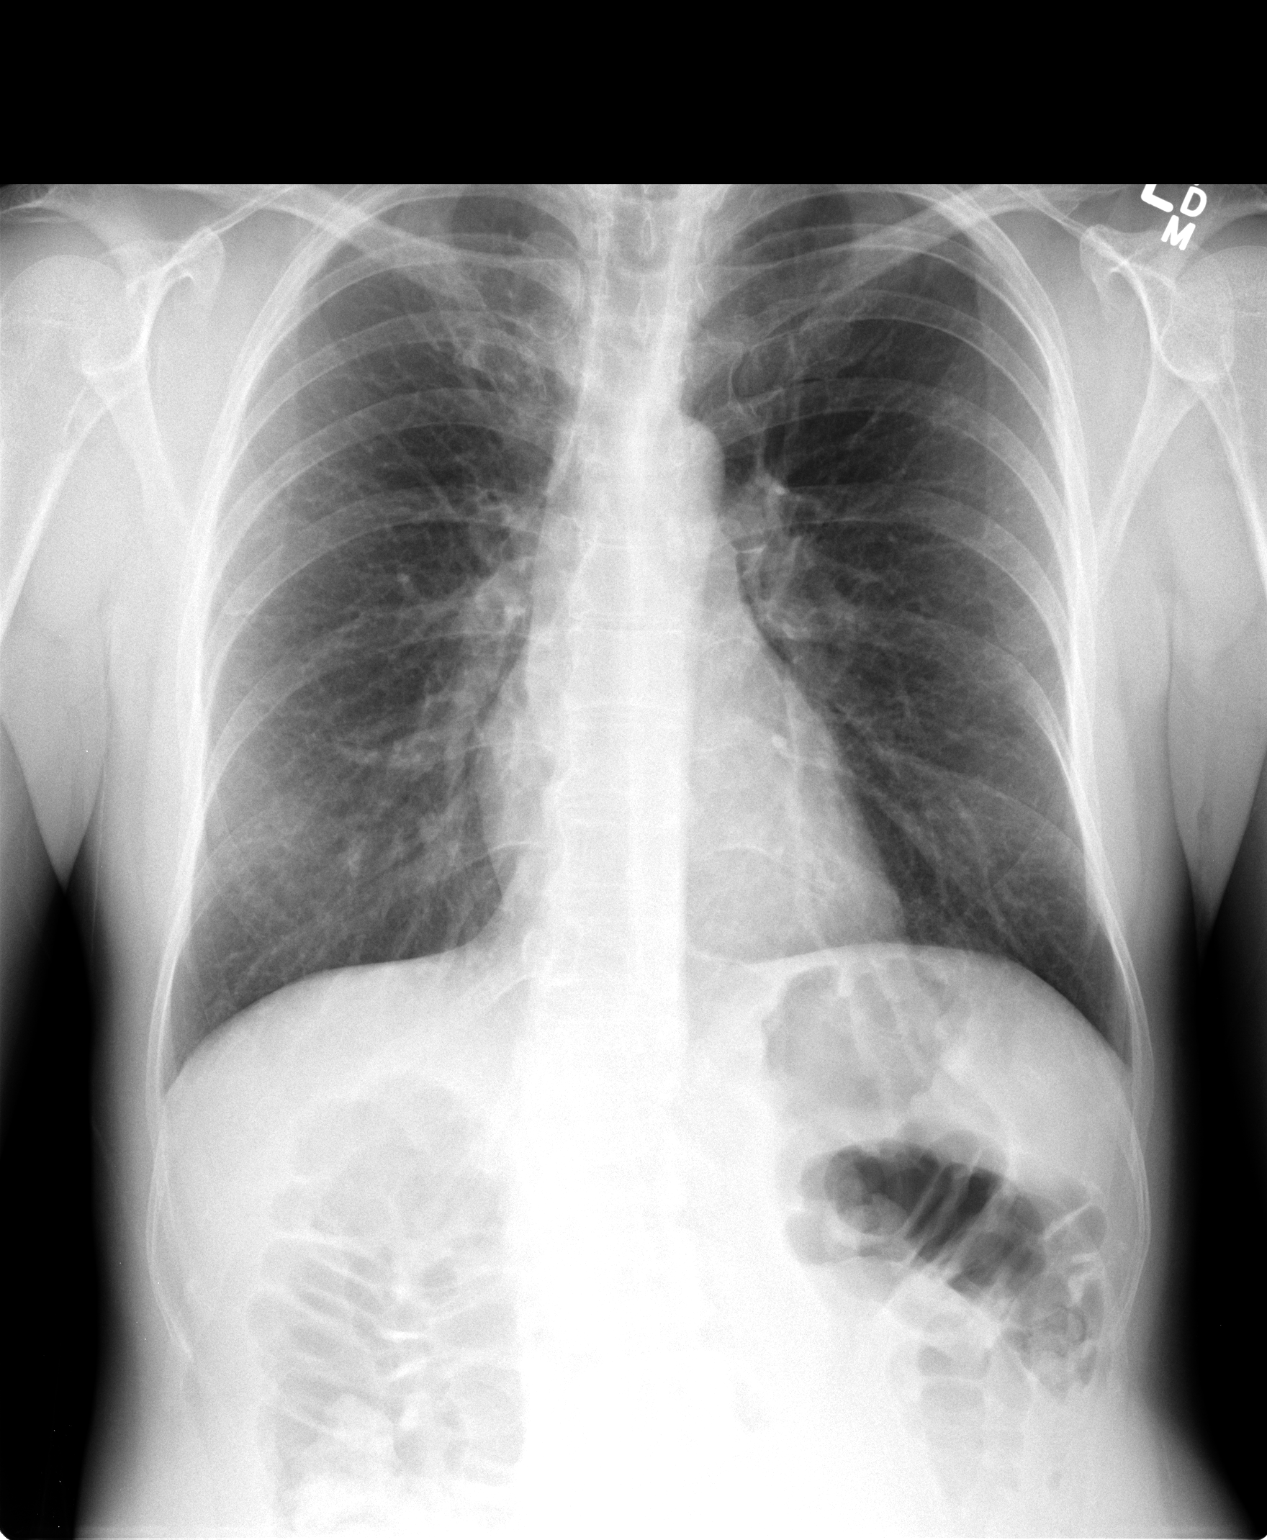

[view not recorded (2 of 2)]
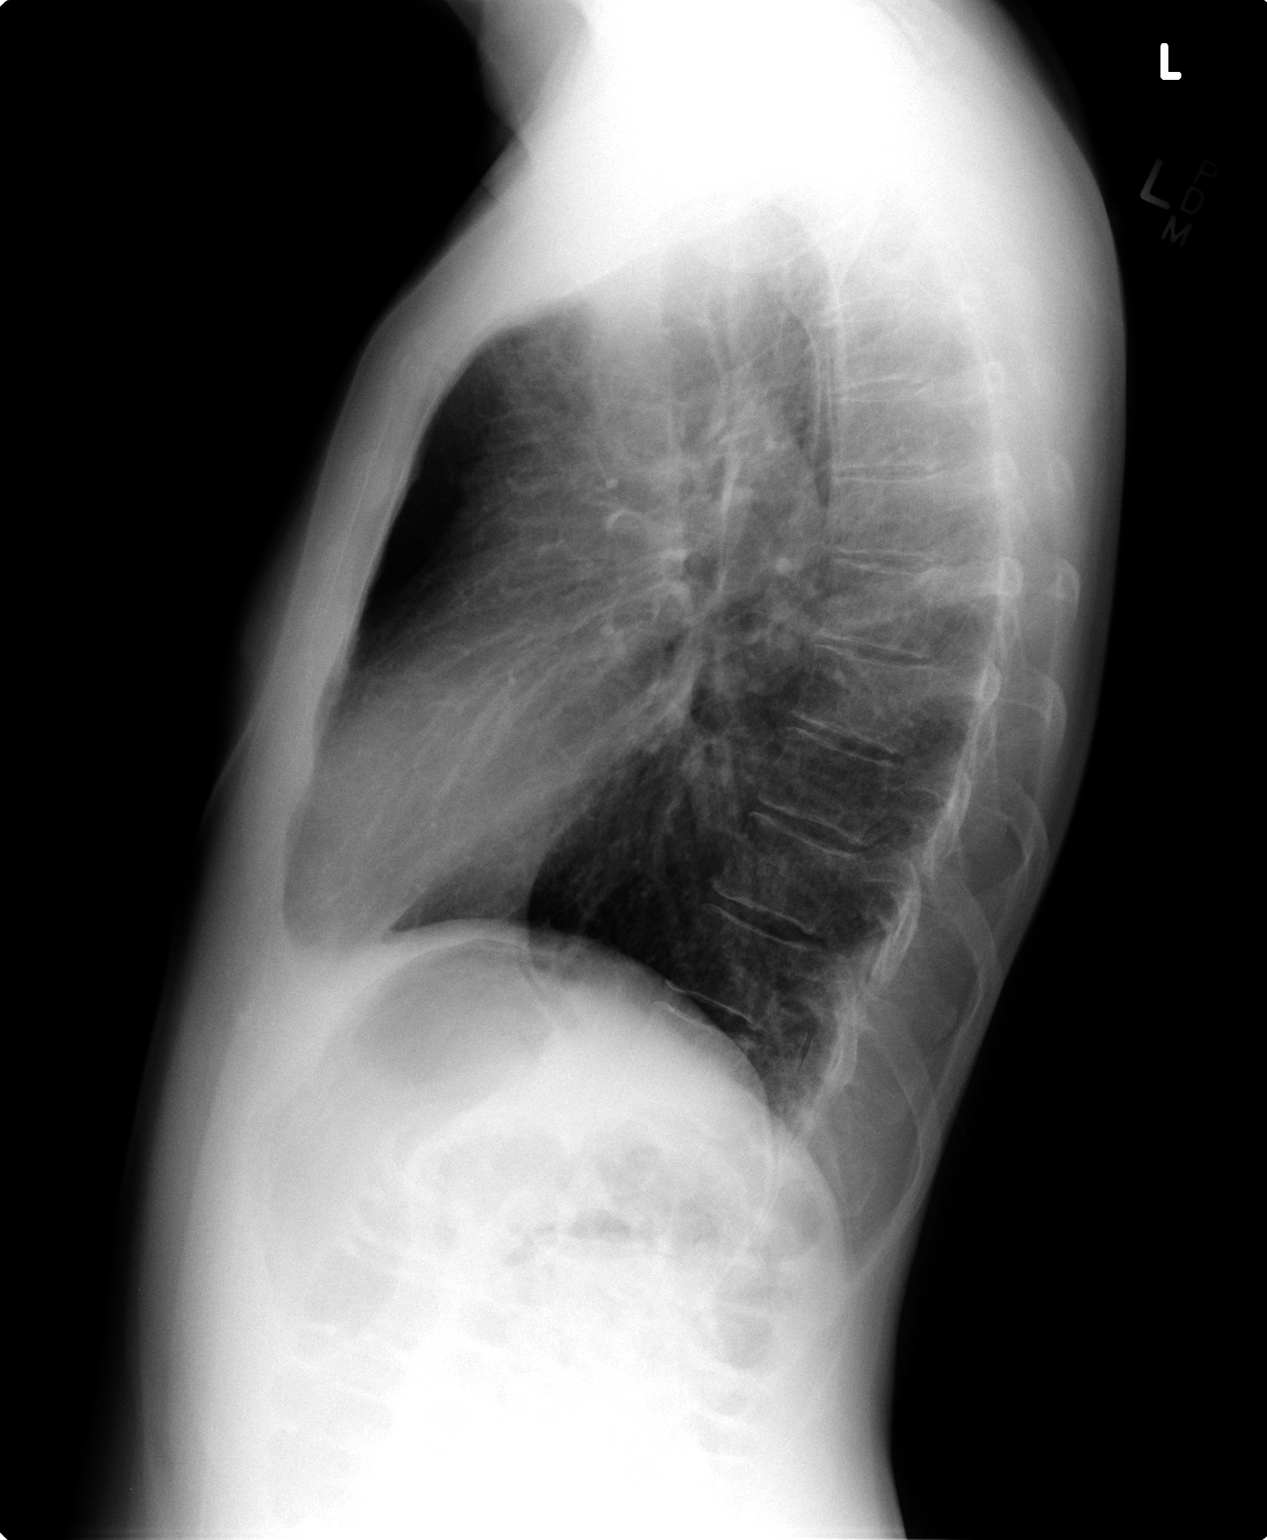

[2 of 2 positions shown; findings below may reference images not displayed]

FINDINGS: The cardiac shadow is stable. The lungs are well aerated
bilaterally. Mild interstitial changes are again seen without focal
confluent infiltrate. No bony abnormality is noted.
IMPRESSION: No acute abnormality seen.

## 2016-01-14 ENCOUNTER — Other Ambulatory Visit: Payer: Self-pay | Admitting: Internal Medicine

## 2016-02-27 ENCOUNTER — Telehealth: Payer: Self-pay | Admitting: Internal Medicine

## 2016-02-28 NOTE — Telephone Encounter (Signed)
Overdue for CPE has been told twice on fills. Can we get on schedule

## 2016-02-28 NOTE — Telephone Encounter (Signed)
PT INFORMED

## 2016-02-28 NOTE — Telephone Encounter (Signed)
Patient is out of medication.  Patient is requesting refills to get her through until her scheduled appt on 5/24. Patient is worried about medication not being in system enough for labs when they are drawn.  Please follow up with patient in regards.

## 2016-02-28 NOTE — Telephone Encounter (Signed)
Called patient left a VM. Told her she must schedule an app.

## 2016-03-24 ENCOUNTER — Encounter: Payer: Self-pay | Admitting: Internal Medicine

## 2016-03-24 LAB — HM MAMMOGRAPHY: HM MAMMO: NORMAL (ref 0–4)

## 2016-03-24 LAB — HM PAP SMEAR

## 2016-03-25 ENCOUNTER — Encounter: Payer: Self-pay | Admitting: Internal Medicine

## 2016-03-25 ENCOUNTER — Telehealth: Payer: Self-pay

## 2016-03-25 ENCOUNTER — Other Ambulatory Visit (INDEPENDENT_AMBULATORY_CARE_PROVIDER_SITE_OTHER): Payer: Managed Care, Other (non HMO)

## 2016-03-25 ENCOUNTER — Ambulatory Visit (INDEPENDENT_AMBULATORY_CARE_PROVIDER_SITE_OTHER): Payer: Managed Care, Other (non HMO) | Admitting: Internal Medicine

## 2016-03-25 VITALS — BP 100/62 | HR 79 | Temp 98.2°F | Resp 16 | Ht 62.5 in | Wt 109.0 lb

## 2016-03-25 DIAGNOSIS — Z0000 Encounter for general adult medical examination without abnormal findings: Secondary | ICD-10-CM

## 2016-03-25 DIAGNOSIS — K2100 Gastro-esophageal reflux disease with esophagitis, without bleeding: Secondary | ICD-10-CM

## 2016-03-25 DIAGNOSIS — E038 Other specified hypothyroidism: Secondary | ICD-10-CM

## 2016-03-25 DIAGNOSIS — F418 Other specified anxiety disorders: Secondary | ICD-10-CM | POA: Diagnosis not present

## 2016-03-25 DIAGNOSIS — R634 Abnormal weight loss: Secondary | ICD-10-CM

## 2016-03-25 DIAGNOSIS — K589 Irritable bowel syndrome without diarrhea: Secondary | ICD-10-CM | POA: Diagnosis not present

## 2016-03-25 DIAGNOSIS — R194 Change in bowel habit: Secondary | ICD-10-CM

## 2016-03-25 LAB — COMPREHENSIVE METABOLIC PANEL
ALK PHOS: 110 U/L (ref 39–117)
ALT: 41 U/L — AB (ref 0–35)
AST: 31 U/L (ref 0–37)
Albumin: 4.7 g/dL (ref 3.5–5.2)
BILIRUBIN TOTAL: 0.7 mg/dL (ref 0.2–1.2)
BUN: 16 mg/dL (ref 6–23)
CO2: 31 meq/L (ref 19–32)
Calcium: 10 mg/dL (ref 8.4–10.5)
Chloride: 103 mEq/L (ref 96–112)
Creatinine, Ser: 1.04 mg/dL (ref 0.40–1.20)
GFR: 60.95 mL/min (ref >60.00)
GLUCOSE: 76 mg/dL (ref 70–99)
Potassium: 4.3 mEq/L (ref 3.5–5.1)
SODIUM: 139 meq/L (ref 135–145)
TOTAL PROTEIN: 7.6 g/dL (ref 6.0–8.3)

## 2016-03-25 LAB — LIPID PANEL
CHOL/HDL RATIO: 3
Cholesterol: 261 mg/dL — ABNORMAL HIGH (ref 0–200)
HDL: 88.4 mg/dL (ref >39.00)
LDL Cholesterol: 157 mg/dL — ABNORMAL HIGH (ref 0–99)
NONHDL: 172.48
Triglycerides: 78 mg/dL (ref 0.0–149.0)
VLDL: 15.6 mg/dL (ref 0.0–40.0)

## 2016-03-25 LAB — CBC WITH DIFFERENTIAL/PLATELET
BASOS ABS: 0 10*3/uL (ref 0.0–0.1)
Basophils Relative: 0.6 % (ref 0.0–3.0)
EOS PCT: 2.5 % (ref 0.0–5.0)
Eosinophils Absolute: 0.1 10*3/uL (ref 0.0–0.7)
HCT: 45.3 % (ref 36.0–46.0)
Hemoglobin: 15.5 g/dL — ABNORMAL HIGH (ref 12.0–15.0)
LYMPHS ABS: 1.6 10*3/uL (ref 0.7–4.0)
Lymphocytes Relative: 29.3 % (ref 12.0–46.0)
MCHC: 34.3 g/dL (ref 30.0–36.0)
MCV: 89.3 fl (ref 78.0–100.0)
MONO ABS: 0.5 10*3/uL (ref 0.1–1.0)
Monocytes Relative: 9.8 % (ref 3.0–12.0)
NEUTROS ABS: 3.1 10*3/uL (ref 1.4–7.7)
NEUTROS PCT: 57.8 % (ref 43.0–77.0)
PLATELETS: 350 10*3/uL (ref 150.0–400.0)
RBC: 5.07 Mil/uL (ref 3.87–5.11)
RDW: 12.1 % (ref 11.5–15.5)
WBC: 5.4 10*3/uL (ref 4.0–10.5)

## 2016-03-25 LAB — TSH: TSH: 5.45 u[IU]/mL — AB (ref 0.35–4.50)

## 2016-03-25 MED ORDER — ELUXADOLINE 75 MG PO TABS: 1.0000 | ORAL_TABLET | Freq: Two times a day (BID) | ORAL | Status: DC

## 2016-03-25 NOTE — Telephone Encounter (Signed)
PA initiated via CoverMyMeds key [id number]

## 2016-03-25 NOTE — Progress Notes (Signed)
Pre visit review using our clinic review tool, if applicable. No additional management support is needed unless otherwise documented below in the visit note. 

## 2016-03-25 NOTE — Progress Notes (Signed)
Subjective:  Patient ID: Katrina Ross, female    DOB: 09-25-1971  Age: 45 y.o. MRN: [id number]  CC: Annual Exam and Abdominal Pain   HPI Katrina Ross presents for a CPX-  She complains of a 6 month history of abdominal issues. She has been seeing her gynecologist and tells me that her GYN told her that her examination and labs were normal. Her GYN told her there her rectal exam is normal and there is no blood in her stool. She complains of intermittent episodes of abdominal bloating and pain with mostly diarrhea but also a few episodes of constipation. She said she has about 3 loose bowel movements a day. She has had a few episodes of nausea and mild weight loss of about 4 pounds. She has a history of IBS. She tells me that she is under quite a bit of stress because she is self-employed and her husband recently lost his job and they will lose their health insurance at the end of this month. She complains of the caliber of her stool has decreased, she describes very thin stools that are more frequent.     Past Medical History  Diagnosis Date  . Thyroid disease   . Hypertension   . Ocular migraine   . IBS (irritable bowel syndrome)   . GERD (gastroesophageal reflux disease)   . Depression   . Anxiety    Past Surgical History  Procedure Laterality Date  . Total hip arthroplasty Right     2003  . Nasal septum surgery      2001    reports that she has never smoked. She has never used smokeless tobacco. She reports that she drinks about 1.8 oz of alcohol per week. She reports that she does not use illicit drugs. family history includes Alcohol abuse in her mother; Cancer in her mother. There is no history of Stroke, Arthritis, Asthma, Diabetes, Heart disease, Hypertension, Kidney disease, or Hyperlipidemia. Allergies  Allergen Reactions  . Amoxicillin     itching  . Tramadol Itching    Outpatient Prescriptions Prior to Visit  Medication Sig Dispense Refill  . Multiple  Vitamins-Minerals (MULTIVITAL PO) Take 1 tablet by mouth daily.     . pantoprazole (PROTONIX) 40 MG tablet TAKE 1 TABLET BY MOUTH ONCE DAILY 30-60 MINUTES BEFORE FIRST MEAL OF THE DAY 30 tablet 11  . levothyroxine (SYNTHROID, LEVOTHROID) 50 MCG tablet TAKE 1 TABLET BY MOUTH DAILY BEFORE BREAKFAST. YEARLY PHYSICAL WITH LABS ARE DUE,MUST SEE MD FOR REFILLS 30 tablet 0  . levothyroxine (SYNTHROID, LEVOTHROID) 75 MCG tablet Take 1 tablet (75 mcg total) by mouth daily. (Patient not taking: Reported on 03/25/2016) 90 tablet 1  . norethindrone (ERRIN) 0.35 MG tablet Take 1 tablet by mouth daily.     No facility-administered medications prior to visit.    ROS Review of Systems  Constitutional: Positive for unexpected weight change. Negative for fever, chills, diaphoresis, activity change, appetite change and fatigue.  HENT: Negative.  Negative for trouble swallowing.   Eyes: Negative.   Respiratory: Negative.  Negative for cough, choking, chest tightness, shortness of breath and stridor.   Cardiovascular: Negative.  Negative for chest pain, palpitations and leg swelling.  Gastrointestinal: Positive for nausea, abdominal pain and diarrhea. Negative for vomiting, constipation, blood in stool, anal bleeding and rectal pain.  Endocrine: Negative.   Genitourinary: Negative.  Negative for dysuria, urgency, hematuria, decreased urine volume and difficulty urinating.  Musculoskeletal: Negative.  Negative for back pain and arthralgias.  Skin:  Negative.  Negative for color change and rash.  Allergic/Immunologic: Negative.   Neurological: Negative.  Negative for dizziness, tremors, weakness and numbness.  Hematological: Negative.  Negative for adenopathy. Does not bruise/bleed easily.  Psychiatric/Behavioral: Negative.  Negative for suicidal ideas, sleep disturbance, decreased concentration and agitation. The patient is not nervous/anxious.     Objective:  BP 100/62 mmHg  Pulse 79  Temp(Src) 98.2 F (36.8  C) (Oral)  Resp 16  Ht 5' 2.5" (1.588 m)  Wt 109 lb (49.442 kg)  BMI 19.61 kg/m2  SpO2 92%  BP Readings from Last 3 Encounters:  03/25/16 100/62  01/02/15 116/78  04/18/14 102/60    Wt Readings from Last 3 Encounters:  03/25/16 109 lb (49.442 kg)  01/02/15 113 lb (51.256 kg)  04/18/14 113 lb 4 oz (51.37 kg)    Physical Exam  Constitutional: She is oriented to person, place, and time. No distress.  HENT:  Mouth/Throat: Oropharynx is clear and moist. No oropharyngeal exudate.  Eyes: Conjunctivae are normal. Right eye exhibits no discharge. Left eye exhibits no discharge. No scleral icterus.  Neck: Normal range of motion. Neck supple. No JVD present. No tracheal deviation present. No thyromegaly present.  Cardiovascular: Normal rate, regular rhythm, normal heart sounds and intact distal pulses.  Exam reveals no gallop and no friction rub.   No murmur heard. Pulmonary/Chest: Effort normal and breath sounds normal. No stridor. No respiratory distress. She has no wheezes. She has no rales. She exhibits no tenderness.  Abdominal: Soft. Normal appearance and bowel sounds are normal. She exhibits no distension and no mass. There is no hepatosplenomegaly, splenomegaly or hepatomegaly. There is no tenderness. There is no rigidity, no rebound, no guarding, no CVA tenderness, no tenderness at McBurney's point and negative Murphy's sign.  Musculoskeletal: Normal range of motion. She exhibits no edema or tenderness.  Lymphadenopathy:    She has no cervical adenopathy.  Neurological: She is oriented to person, place, and time.  Skin: Skin is warm and dry. No rash noted. She is not diaphoretic. No erythema. No pallor.  Psychiatric: Her behavior is normal. Judgment and thought content normal. Her mood appears anxious. Her affect is not angry, not blunt, not labile and not inappropriate. Her speech is not delayed and not tangential. Cognition and memory are normal. She does not exhibit a depressed  mood.  Vitals reviewed.   Lab Results  Component Value Date   WBC 5.4 03/25/2016   HGB 15.5* 03/25/2016   HCT 45.3 03/25/2016   PLT 350.0 03/25/2016   GLUCOSE 76 03/25/2016   CHOL 261* 03/25/2016   TRIG 78.0 03/25/2016   HDL 88.40 03/25/2016   LDLDIRECT 123.8 05/31/2013   LDLCALC 157* 03/25/2016   ALT 41* 03/25/2016   AST 31 03/25/2016   NA 139 03/25/2016   K 4.3 03/25/2016   CL 103 03/25/2016   CREATININE 1.04 03/25/2016   BUN 16 03/25/2016   CO2 31 03/25/2016   TSH 5.45* 03/25/2016    Dg Chest 2 View  04/18/2014  CLINICAL DATA:  Chest pain EXAM: CHEST  2 VIEW COMPARISON:  05/31/2013 FINDINGS: The cardiac shadow is stable. The lungs are well aerated bilaterally. Mild interstitial changes are again seen without focal confluent infiltrate. No bony abnormality is noted. IMPRESSION: No acute abnormality seen. Electronically Signed   By: Alcide Clever M.D.   On: 04/18/2014 16:49    Assessment & Plan:   Nayara was seen today for annual exam and abdominal pain.  Diagnoses and all orders  for this visit:  Routine general medical examination at a health care facility- Exam complete, labs ordered and reviewed, her Pap and mammogram are up-to-date, patient education material was given. -     Lipid panel; Future -     Comprehensive metabolic panel; Future -     CBC with Differential/Platelet; Future -     TSH; Future  IBS (irritable bowel syndrome)- I think Viberzi would help with her symptoms -     Eluxadoline (VIBERZI) 75 MG TABS; Take 1 tablet by mouth 2 (two) times daily.  Gastroesophageal reflux disease with esophagitis  Depression with anxiety  Loss of weight- her exam and labs are not concerning for any metabolic causes of weight loss, and becoming concerned about depression and anxiety so we'll continue to monitor that but for now I've asked her to see GI to evaluate her lower GI symptoms. -     Ambulatory referral to Gastroenterology  Bowel habit changes- I've asked  her to see GI she may need lower endoscopy -     Ambulatory referral to Gastroenterology  Other specified hypothyroidism- she tells me that she has been taking 50 g of levothyroxine and her TSH is elevated, she has some suspicious symptoms so I will increase her dose to 75 g daily -     levothyroxine (SYNTHROID, LEVOTHROID) 75 MCG tablet; Take 1 tablet (75 mcg total) by mouth daily.   I have discontinued Ms. Mantia's norethindrone. I am also having her start on Eluxadoline. Additionally, I am having her maintain her Multiple Vitamins-Minerals (MULTIVITAL PO), pantoprazole, venlafaxine XR, omeprazole, and levothyroxine.  Meds ordered this encounter  Medications  . venlafaxine XR (EFFEXOR-XR) 37.5 MG 24 hr capsule    Sig: Reported on 03/25/2016  . omeprazole (PRILOSEC) 40 MG capsule    Sig:   . Eluxadoline (VIBERZI) 75 MG TABS    Sig: Take 1 tablet by mouth 2 (two) times daily.    Dispense:  180 tablet    Refill:  1  . levothyroxine (SYNTHROID, LEVOTHROID) 75 MCG tablet    Sig: Take 1 tablet (75 mcg total) by mouth daily.    Dispense:  90 tablet    Refill:  1     Follow-up: Return in about 6 weeks (around 05/06/2016).  Sanda Linger, MD

## 2016-03-25 NOTE — Patient Instructions (Signed)

## 2016-03-26 ENCOUNTER — Encounter: Payer: Self-pay | Admitting: Internal Medicine

## 2016-03-26 NOTE — Telephone Encounter (Signed)
Fax received stating medication is a covered benefit and does not require a prior authorization

## 2016-03-27 MED ORDER — LEVOTHYROXINE SODIUM 75 MCG PO TABS: 75.0000 ug | ORAL_TABLET | Freq: Every day | ORAL | Status: DC

## 2016-03-31 ENCOUNTER — Ambulatory Visit: Payer: Managed Care, Other (non HMO) | Admitting: Internal Medicine

## 2016-11-15 ENCOUNTER — Other Ambulatory Visit: Payer: Self-pay | Admitting: Internal Medicine

## 2016-11-15 DIAGNOSIS — E038 Other specified hypothyroidism: Secondary | ICD-10-CM

## 2017-07-23 ENCOUNTER — Telehealth: Payer: Self-pay | Admitting: Internal Medicine

## 2017-07-23 DIAGNOSIS — E038 Other specified hypothyroidism: Secondary | ICD-10-CM

## 2017-07-26 MED ORDER — LEVOTHYROXINE SODIUM 75 MCG PO TABS: 75.0000 ug | ORAL_TABLET | Freq: Every day | ORAL | 0 refills | Status: DC

## 2017-07-26 NOTE — Addendum Note (Signed)
Addended by: Deatra James on: 07/26/2017 09:05 AM   Modules accepted: Orders

## 2017-07-26 NOTE — Telephone Encounter (Signed)
Per office policy sent 30 day to local pharmacy until appt.../lmb  

## 2017-07-26 NOTE — Telephone Encounter (Signed)
Appt has been made for 10/17, can patient to get a refill to last her through  to her appt?  Please advsie

## 2017-08-18 ENCOUNTER — Ambulatory Visit (INDEPENDENT_AMBULATORY_CARE_PROVIDER_SITE_OTHER): Payer: BLUE CROSS/BLUE SHIELD | Admitting: Internal Medicine

## 2017-08-18 ENCOUNTER — Encounter: Payer: Self-pay | Admitting: Internal Medicine

## 2017-08-18 ENCOUNTER — Other Ambulatory Visit (INDEPENDENT_AMBULATORY_CARE_PROVIDER_SITE_OTHER): Payer: BLUE CROSS/BLUE SHIELD

## 2017-08-18 VITALS — BP 112/80 | HR 80 | Temp 98.4°F | Resp 16 | Ht 62.5 in | Wt 116.0 lb

## 2017-08-18 DIAGNOSIS — E039 Hypothyroidism, unspecified: Secondary | ICD-10-CM | POA: Diagnosis not present

## 2017-08-18 DIAGNOSIS — Z0000 Encounter for general adult medical examination without abnormal findings: Secondary | ICD-10-CM | POA: Diagnosis not present

## 2017-08-18 DIAGNOSIS — K2100 Gastro-esophageal reflux disease with esophagitis, without bleeding: Secondary | ICD-10-CM

## 2017-08-18 DIAGNOSIS — R748 Abnormal levels of other serum enzymes: Secondary | ICD-10-CM | POA: Diagnosis not present

## 2017-08-18 DIAGNOSIS — Z23 Encounter for immunization: Secondary | ICD-10-CM

## 2017-08-18 DIAGNOSIS — K581 Irritable bowel syndrome with constipation: Secondary | ICD-10-CM

## 2017-08-18 DIAGNOSIS — Z1231 Encounter for screening mammogram for malignant neoplasm of breast: Secondary | ICD-10-CM

## 2017-08-18 DIAGNOSIS — E038 Other specified hypothyroidism: Secondary | ICD-10-CM

## 2017-08-18 LAB — CBC WITH DIFFERENTIAL/PLATELET
BASOS ABS: 0.1 10*3/uL (ref 0.0–0.1)
Basophils Relative: 0.9 % (ref 0.0–3.0)
EOS ABS: 0.2 10*3/uL (ref 0.0–0.7)
Eosinophils Relative: 2.6 % (ref 0.0–5.0)
HEMATOCRIT: 41.9 % (ref 36.0–46.0)
HEMOGLOBIN: 14 g/dL (ref 12.0–15.0)
LYMPHS PCT: 29.3 % (ref 12.0–46.0)
Lymphs Abs: 1.9 10*3/uL (ref 0.7–4.0)
MCHC: 33.4 g/dL (ref 30.0–36.0)
MCV: 92.5 fl (ref 78.0–100.0)
Monocytes Absolute: 0.7 10*3/uL (ref 0.1–1.0)
Monocytes Relative: 10 % (ref 3.0–12.0)
Neutro Abs: 3.7 10*3/uL (ref 1.4–7.7)
Neutrophils Relative %: 57.2 % (ref 43.0–77.0)
Platelets: 325 10*3/uL (ref 150.0–400.0)
RBC: 4.53 Mil/uL (ref 3.87–5.11)
RDW: 12.2 % (ref 11.5–15.5)
WBC: 6.5 10*3/uL (ref 4.0–10.5)

## 2017-08-18 LAB — LIPID PANEL
CHOLESTEROL: 252 mg/dL — AB (ref 0–200)
HDL: 99.2 mg/dL (ref >39.00)
LDL CALC: 134 mg/dL — AB (ref 0–99)
NonHDL: 152.46
TRIGLYCERIDES: 93 mg/dL (ref 0.0–149.0)
Total CHOL/HDL Ratio: 3
VLDL: 18.6 mg/dL (ref 0.0–40.0)

## 2017-08-18 LAB — COMPREHENSIVE METABOLIC PANEL
ALBUMIN: 4.5 g/dL (ref 3.5–5.2)
ALT: 11 U/L (ref 0–35)
AST: 17 U/L (ref 0–37)
Alkaline Phosphatase: 109 U/L (ref 39–117)
BILIRUBIN TOTAL: 0.5 mg/dL (ref 0.2–1.2)
BUN: 17 mg/dL (ref 6–23)
CALCIUM: 9.5 mg/dL (ref 8.4–10.5)
CHLORIDE: 101 meq/L (ref 96–112)
CO2: 29 mEq/L (ref 19–32)
CREATININE: 0.88 mg/dL (ref 0.40–1.20)
GFR: 73.45 mL/min (ref >60.00)
Glucose, Bld: 84 mg/dL (ref 70–99)
Potassium: 3.7 mEq/L (ref 3.5–5.1)
Sodium: 138 mEq/L (ref 135–145)
Total Protein: 7.3 g/dL (ref 6.0–8.3)

## 2017-08-18 LAB — TSH: TSH: 1.55 u[IU]/mL (ref 0.35–4.50)

## 2017-08-18 MED ORDER — LUBIPROSTONE 8 MCG PO CAPS: 8.0000 ug | ORAL_CAPSULE | Freq: Two times a day (BID) | ORAL | 1 refills | Status: AC

## 2017-08-18 NOTE — Patient Instructions (Signed)
Irritable Bowel Syndrome, Adult Irritable bowel syndrome (IBS) is not one specific disease. It is a group of symptoms that affects the organs responsible for digestion (gastrointestinal or GI tract). To regulate how your GI tract works, your body sends signals back and forth between your intestines and your brain. If you have IBS, there may be a problem with these signals. As a result, your GI tract does not function normally. Your intestines may become more sensitive and overreact to certain things. This is especially true when you eat certain foods or when you are under stress. There are four types of IBS. These may be determined based on the consistency of your stool:  IBS with diarrhea.  IBS with constipation.  Mixed IBS.  Unsubtyped IBS.  It is important to know which type of IBS you have. Some treatments are more likely to be helpful for certain types of IBS. What are the causes? The exact cause of IBS is not known. What increases the risk? You may have a higher risk of IBS if:  You are a woman.  You are younger than 45 years old.  You have a family history of IBS.  You have mental health problems.  You have had bacterial infection of your GI tract.  What are the signs or symptoms? Symptoms of IBS vary from person to person. The main symptom is abdominal pain or discomfort. Additional symptoms usually include one or more of the following:  Diarrhea, constipation, or both.  Abdominal swelling or bloating.  Feeling full or sick after eating a small or regular-size meal.  Frequent gas.  Mucus in the stool.  A feeling of having more stool left after a bowel movement.  Symptoms tend to come and go. They may be associated with stress, psychiatric conditions, or nothing at all. How is this diagnosed? There is no specific test to diagnose IBS. Your health care provider will make a diagnosis based on a physical exam, medical history, and your symptoms. You may have other  tests to rule out other conditions that may be causing your symptoms. These may include:  Blood tests.  X-rays.  CT scan.  Endoscopy and colonoscopy. This is a test in which your GI tract is viewed with a long, thin, flexible tube.  How is this treated? There is no cure for IBS, but treatment can help relieve symptoms. IBS treatment often includes:  Changes to your diet, such as: ? Eating more fiber. ? Avoiding foods that cause symptoms. ? Drinking more water. ? Eating regular, medium-sized portioned meals.  Medicines. These may include: ? Fiber supplements if you have constipation. ? Medicine to control diarrhea (antidiarrheal medicines). ? Medicine to help control muscle spasms in your GI tract (antispasmodic medicines). ? Medicines to help with any mental health issues, such as antidepressants or tranquilizers.  Therapy. ? Talk therapy may help with anxiety, depression, or other mental health issues that can make IBS symptoms worse.  Stress reduction. ? Managing your stress can help keep symptoms under control.  Follow these instructions at home:  Take medicines only as directed by your health care provider.  Eat a healthy diet. ? Avoid foods and drinks with added sugar. ? Include more whole grains, fruits, and vegetables gradually into your diet. This may be especially helpful if you have IBS with constipation. ? Avoid any foods and drinks that make your symptoms worse. These may include dairy products and caffeinated or carbonated drinks. ? Do not eat large meals. ? Drink enough   fluid to keep your urine clear or pale yellow.  Exercise regularly. Ask your health care provider for recommendations of good activities for you.  Keep all follow-up visits as directed by your health care provider. This is important. Contact a health care provider if:  You have constant pain.  You have trouble or pain with swallowing.  You have worsening diarrhea. Get help right away  if:  You have severe and worsening abdominal pain.  You have diarrhea and: ? You have a rash, stiff neck, or severe headache. ? You are irritable, sleepy, or difficult to awaken. ? You are weak, dizzy, or extremely thirsty.  You have bright red blood in your stool or you have black tarry stools.  You have unusual abdominal swelling that is painful.  You vomit continuously.  You vomit blood (hematemesis).  You have both abdominal pain and a fever. This information is not intended to replace advice given to you by your health care provider. Make sure you discuss any questions you have with your health care provider. Document Released: 10/19/2005 Document Revised: 03/20/2016 Document Reviewed: 07/06/2014 Elsevier Interactive Patient Education  2018 [employer]  

## 2017-08-18 NOTE — Progress Notes (Signed)
Subjective:  Patient ID: Katrina Ross, female    DOB: 01-Nov-1971  Age: 46 y.o. MRN: [id number]  CC: Abdominal Pain; Hypothyroidism; and Annual Exam   HPI Katrina GoodellRachel Barberi presents for a CPX.  She complains of a several year history of intermittent abdominal pain with constipation, bloating, and aching sensation. She has gained weight recently and denies loss of appetite, odynophagia, dysphagia, diarrhea, blood in her stool, or melena. A history of IBS and took fiber Z several years ago but says it caused constipation. 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  Outpatient Medications Prior to Visit  Medication Sig Dispense Refill  . omeprazole (PRILOSEC) 40 MG capsule     . venlafaxine XR (EFFEXOR-XR) 37.5 MG 24 hr capsule Reported on 03/25/2016    . Eluxadoline (VIBERZI) 75 MG TABS Take 1 tablet by mouth 2 (two) times daily. 180 tablet 1  . levothyroxine (SYNTHROID, LEVOTHROID) 75 MCG tablet Take 1 tablet (75 mcg total) by mouth daily. Must keep appt for future refills 30 tablet 0  . Multiple Vitamins-Minerals (MULTIVITAL PO) Take 1 tablet by mouth daily.     . pantoprazole (PROTONIX) 40 MG tablet TAKE 1 TABLET BY MOUTH ONCE DAILY 30-60 MINUTES BEFORE FIRST MEAL OF THE DAY 30 tablet 11   No facility-administered medications prior to visit.     ROS Review of Systems  Constitutional: Positive for unexpected weight change. Negative for appetite change, diaphoresis and fatigue.  HENT: Negative.   Eyes: Negative.   Respiratory: Negative.  Negative for cough, chest tightness, shortness of breath and wheezing.   Cardiovascular: Negative.  Negative for chest pain, palpitations and leg swelling.  Gastrointestinal: Positive for abdominal pain and constipation. Negative for abdominal distention, anal bleeding, blood in stool, diarrhea, nausea,  rectal pain and vomiting.  Endocrine: Negative.   Genitourinary: Negative.  Negative for decreased urine volume, difficulty urinating, dysuria, flank pain, frequency, urgency, vaginal bleeding and vaginal discharge.  Musculoskeletal: Positive for arthralgias. Negative for back pain and myalgias.  Skin: Negative.  Negative for color change.  Allergic/Immunologic: Negative.   Neurological: Negative.  Negative for dizziness, weakness and light-headedness.  Hematological: Negative for adenopathy. Does not bruise/bleed easily.  Psychiatric/Behavioral: Negative.  Negative for sleep disturbance. The patient is not nervous/anxious.     Objective:  BP 112/80 (BP Location: Left Arm, Patient Position: Sitting, Cuff Size: Normal)   Pulse 80   Temp 98.4 F (36.9 C) (Oral)   Resp 16   Ht 5' 2.5" (1.588 m)   Wt 116 lb (52.6 kg)   LMP 08/29/2013   SpO2 98%   BMI 20.88 kg/m   BP Readings from Last 3 Encounters:  08/18/17 112/80  03/25/16 100/62  01/02/15 116/78    Wt Readings from Last 3 Encounters:  08/18/17 116 lb (52.6 kg)  03/25/16 109 lb (49.4 kg)  01/02/15 113 lb (51.3 kg)    Physical Exam  Constitutional: She is oriented to person, place, and time. No distress.  HENT:  Mouth/Throat: Oropharynx is clear and moist. No oropharyngeal exudate.  Eyes: Conjunctivae are normal. Right eye exhibits no discharge. Left eye exhibits no discharge. No scleral icterus.  Neck: Normal range of motion. Neck supple. No JVD present. No thyromegaly present.  Cardiovascular: Normal rate, regular rhythm and intact distal pulses.  Exam reveals no gallop and no friction rub.  No murmur heard. Pulmonary/Chest: Effort normal and breath sounds normal. No respiratory distress. She has no wheezes. She has no rales. She exhibits no tenderness.  Abdominal: Soft. Bowel sounds are normal. She exhibits no distension and no mass. There is no tenderness. There is no rebound and no guarding.  Musculoskeletal: Normal  range of motion. She exhibits no edema, tenderness or deformity.  Lymphadenopathy:    She has no cervical adenopathy.  Neurological: She is alert and oriented to person, place, and time.  Skin: Skin is warm and dry. No rash noted. She is not diaphoretic. No erythema. No pallor.  Psychiatric: She has a normal mood and affect. Her behavior is normal. Judgment and thought content normal.  Vitals reviewed.   Lab Results  Component Value Date   WBC 6.5 08/18/2017   HGB 14.0 08/18/2017   HCT 41.9 08/18/2017   PLT 325.0 08/18/2017   GLUCOSE 84 08/18/2017   CHOL 252 (H) 08/18/2017   TRIG 93.0 08/18/2017   HDL 99.20 08/18/2017   LDLDIRECT 123.8 05/31/2013   LDLCALC 134 (H) 08/18/2017   ALT 11 08/18/2017   AST 17 08/18/2017   NA 138 08/18/2017   K 3.7 08/18/2017   CL 101 08/18/2017   CREATININE 0.88 08/18/2017   BUN 17 08/18/2017   CO2 29 08/18/2017   TSH 1.55 08/18/2017    Dg Chest 2 View  Result Date: 04/18/2014 CLINICAL DATA:  Chest pain EXAM: CHEST  2 VIEW COMPARISON:  05/31/2013 FINDINGS: The cardiac shadow is stable. The lungs are well aerated bilaterally. Mild interstitial changes are again seen without focal confluent infiltrate. No bony abnormality is noted. IMPRESSION: No acute abnormality seen. Electronically Signed   By: Alcide Clever M.D.   On: 04/18/2014 16:49    Assessment & Plan:   Louellen was seen today for abdominal pain, hypothyroidism and annual exam.  Diagnoses and all orders for this visit:  Need for influenza vaccination -     Flu Vaccine QUAD 36+ mos IM  Irritable bowel syndrome with constipation- will try Amitiza for this -     lubiprostone (AMITIZA) 8 MCG capsule; Take 1 capsule (8 mcg total) by mouth 2 (two) times daily with a meal.  Gastroesophageal reflux disease with esophagitis- her sx's are well controlled -     CBC with Differential/Platelet; Future  Routine general medical examination at a health care facility- exam completed, labs reviewed,  vaccines reviewed and updated, her Pap smear is up-to-date, she was referred for screening mammogram, patient education material was given. -     Lipid panel; Future  Elevated liver enzymes- her LFTs are normal now. She agrees to start the vaccine process for hepatitis A and B. -     Comprehensive metabolic panel; Future  Acquired hypothyroidism- TSH is in the normal range. She will continue on the current dose of levothyroxine. -     TSH; Future -     levothyroxine (SYNTHROID, LEVOTHROID) 75 MCG tablet; Take 1 tablet (75 mcg total) by mouth daily. Must keep appt for future refills  Need for immunization against viral hepatitis -     Hepatitis A hepatitis B combined vaccine IM  Other specified hypothyroidism -     levothyroxine (SYNTHROID, LEVOTHROID) 75 MCG tablet; Take 1 tablet (75 mcg total) by mouth daily. Must keep appt for future refills   I have discontinued Ms. Womac's Multiple Vitamins-Minerals (MULTIVITAL PO), pantoprazole, and Eluxadoline. I am also having her start on lubiprostone. Additionally, I am having her maintain  her venlafaxine XR, omeprazole, and levothyroxine.  Meds ordered this encounter  Medications  . lubiprostone (AMITIZA) 8 MCG capsule    Sig: Take 1 capsule (8 mcg total) by mouth 2 (two) times daily with a meal.    Dispense:  180 capsule    Refill:  1  . levothyroxine (SYNTHROID, LEVOTHROID) 75 MCG tablet    Sig: Take 1 tablet (75 mcg total) by mouth daily. Must keep appt for future refills    Dispense:  90 tablet    Refill:  1     Follow-up: Return in about 3 months (around 11/18/2017).  Sanda Linger, MD

## 2017-08-19 ENCOUNTER — Encounter: Payer: Self-pay | Admitting: Internal Medicine

## 2017-08-19 MED ORDER — LEVOTHYROXINE SODIUM 75 MCG PO TABS: 75.0000 ug | ORAL_TABLET | Freq: Every day | ORAL | 1 refills | Status: DC

## 2017-08-20 DIAGNOSIS — Z1231 Encounter for screening mammogram for malignant neoplasm of breast: Secondary | ICD-10-CM | POA: Insufficient documentation

## 2017-08-27 ENCOUNTER — Telehealth: Payer: Self-pay | Admitting: Internal Medicine

## 2017-08-27 DIAGNOSIS — E038 Other specified hypothyroidism: Secondary | ICD-10-CM

## 2017-08-27 DIAGNOSIS — E039 Hypothyroidism, unspecified: Secondary | ICD-10-CM

## 2017-08-27 MED ORDER — LEVOTHYROXINE SODIUM 75 MCG PO TABS: 75.0000 ug | ORAL_TABLET | Freq: Every day | ORAL | 1 refills | Status: AC

## 2017-08-27 NOTE — Telephone Encounter (Signed)
The pharmacy did not receive the levothyroxine (SYNTHROID, LEVOTHROID) 75 MCG tablet prescription. Can you resend this please?

## 2017-08-27 NOTE — Telephone Encounter (Signed)
erx resent 

## 2017-09-20 ENCOUNTER — Ambulatory Visit: Payer: BLUE CROSS/BLUE SHIELD

## 2017-09-20 DIAGNOSIS — Z299 Encounter for prophylactic measures, unspecified: Secondary | ICD-10-CM

## 2017-10-08 ENCOUNTER — Ambulatory Visit
Admission: RE | Admit: 2017-10-08 | Discharge: 2017-10-08 | Disposition: A | Discharge status: Home / Self Care | Payer: BLUE CROSS/BLUE SHIELD | Source: Ambulatory Visit | Attending: Internal Medicine | Admitting: Internal Medicine

## 2017-10-08 DIAGNOSIS — Z1231 Encounter for screening mammogram for malignant neoplasm of breast: Secondary | ICD-10-CM

## 2017-10-12 LAB — HM MAMMOGRAPHY

## 2018-01-10 ENCOUNTER — Ambulatory Visit (INDEPENDENT_AMBULATORY_CARE_PROVIDER_SITE_OTHER): Payer: BLUE CROSS/BLUE SHIELD | Admitting: *Deleted

## 2018-01-10 DIAGNOSIS — Z23 Encounter for immunization: Secondary | ICD-10-CM | POA: Diagnosis not present

## 2018-04-26 ENCOUNTER — Other Ambulatory Visit: Payer: Self-pay | Admitting: Internal Medicine

## 2018-04-26 NOTE — Telephone Encounter (Signed)
Left message for patient to call back to schedule.  °

## 2018-04-26 NOTE — Telephone Encounter (Signed)
Pt needs an appt

## 2018-11-08 ENCOUNTER — Other Ambulatory Visit: Payer: Self-pay | Admitting: Internal Medicine

## 2018-11-08 DIAGNOSIS — E038 Other specified hypothyroidism: Secondary | ICD-10-CM

## 2018-11-08 DIAGNOSIS — E039 Hypothyroidism, unspecified: Secondary | ICD-10-CM

## 2018-11-09 ENCOUNTER — Other Ambulatory Visit: Payer: Self-pay | Admitting: Internal Medicine

## 2018-11-09 DIAGNOSIS — E039 Hypothyroidism, unspecified: Secondary | ICD-10-CM

## 2018-11-09 DIAGNOSIS — E038 Other specified hypothyroidism: Secondary | ICD-10-CM
